# Patient Record
Sex: Male | Born: 1975 | Race: White | Hispanic: No | Marital: Married | Smoking: Never smoker
Health system: Southern US, Community
[De-identification: ages and names within clinical notes are randomized; demographics above are authoritative.]

## PROBLEM LIST (undated history)

## (undated) DIAGNOSIS — G2581 Restless legs syndrome: Secondary | ICD-10-CM

## (undated) HISTORY — DX: Restless legs syndrome: G25.81

## (undated) HISTORY — PX: LIPOMA EXCISION: SHX5283

## (undated) HISTORY — PX: FOOT SURGERY: SHX648

## (undated) HISTORY — PX: COCCYX REMOVAL: SHX600

## (undated) HISTORY — PX: HERNIA REPAIR: SHX51

---

## 2009-09-25 ENCOUNTER — Emergency Department (HOSPITAL_COMMUNITY): Admission: EM | Admit: 2009-09-25 | Discharge: 2009-09-26 | Payer: Self-pay | Admitting: Emergency Medicine

## 2014-01-12 ENCOUNTER — Ambulatory Visit (INDEPENDENT_AMBULATORY_CARE_PROVIDER_SITE_OTHER): Payer: BC Managed Care – PPO

## 2014-01-12 ENCOUNTER — Encounter: Payer: Self-pay | Admitting: Podiatry

## 2014-01-12 ENCOUNTER — Ambulatory Visit (INDEPENDENT_AMBULATORY_CARE_PROVIDER_SITE_OTHER): Payer: BC Managed Care – PPO | Admitting: Podiatry

## 2014-01-12 VITALS — BP 120/81 | HR 66 | Resp 16 | Ht 70.5 in | Wt 170.0 lb

## 2014-01-12 DIAGNOSIS — M79674 Pain in right toe(s): Secondary | ICD-10-CM

## 2014-01-12 DIAGNOSIS — M79609 Pain in unspecified limb: Secondary | ICD-10-CM

## 2014-01-12 DIAGNOSIS — M7750 Other enthesopathy of unspecified foot: Secondary | ICD-10-CM

## 2014-01-12 DIAGNOSIS — S92919A Unspecified fracture of unspecified toe(s), initial encounter for closed fracture: Secondary | ICD-10-CM

## 2014-01-12 NOTE — Progress Notes (Signed)
   Subjective:    Patient ID: Matthew Ellis, male    DOB: [birth year], 38 y.o.   MRN: [id number]  HPI Comments: Hit toe 4 days ago while playing hide and seek with daughter. Right #4 toe , it was swollen. Swelling has gone down but it is still achey      Review of Systems  All other systems reviewed and are negative.       Objective:   Physical Exam        Assessment & Plan:

## 2014-01-12 NOTE — Progress Notes (Signed)
Subjective:     Patient ID: Matthew Ellis, male   DOB: 1976-06-26, 38 y.o.   MRN: [id number]  HPI patient is concerned that he may have broken the fourth toe on his right foot stating that he traumatized it while playing with his daughter 4 days ago. He states the the bones on the outside of his feet are doing very well   Review of Systems  All other systems reviewed and are negative.       Objective:   Physical Exam  Nursing note and vitals reviewed. Constitutional: He is oriented to person, place, and time.  Cardiovascular: Intact distal pulses.   Musculoskeletal: Normal range of motion.  Neurological: He is oriented to person, place, and time.  Skin: Skin is warm.   neurovascular status intact with muscle strength adequate and edema in the proximal portion fourth toe right that's painful when pressed with well-healed surgical sites fifth metatarsals of both feet. Range of motion was found to be within normal limits and no equinus condition noted     Assessment:     Possible fracture fourth toe right versus contusion    Plan:     H&P and x-ray reviewed with patient. Advised on white her tight shoes and if pain persists in 4-6 weeks to consider steroid injection of the fourth digit right foot. Reappoint as needed

## 2014-09-28 ENCOUNTER — Other Ambulatory Visit: Payer: Self-pay | Admitting: Family Medicine

## 2014-09-28 DIAGNOSIS — R109 Unspecified abdominal pain: Secondary | ICD-10-CM

## 2014-10-06 ENCOUNTER — Ambulatory Visit
Admission: RE | Admit: 2014-10-06 | Discharge: 2014-10-06 | Disposition: A | Discharge status: Home / Self Care | Payer: BC Managed Care – PPO | Source: Ambulatory Visit | Attending: Family Medicine | Admitting: Family Medicine

## 2014-10-06 DIAGNOSIS — R109 Unspecified abdominal pain: Secondary | ICD-10-CM

## 2014-10-06 MED ORDER — IOHEXOL 300 MG/ML  SOLN
100.0000 mL | Freq: Once | INTRAMUSCULAR | Status: AC | PRN
  Administered 2014-10-06: 100 mL via INTRAVENOUS

## 2014-10-07 ENCOUNTER — Other Ambulatory Visit: Payer: Self-pay

## 2014-10-27 ENCOUNTER — Other Ambulatory Visit: Payer: Self-pay | Admitting: Gastroenterology

## 2014-10-27 DIAGNOSIS — R109 Unspecified abdominal pain: Secondary | ICD-10-CM

## 2014-11-03 ENCOUNTER — Other Ambulatory Visit: Payer: BC Managed Care – PPO

## 2014-11-04 ENCOUNTER — Ambulatory Visit
Admission: RE | Admit: 2014-11-04 | Discharge: 2014-11-04 | Disposition: A | Discharge status: Home / Self Care | Payer: BC Managed Care – PPO | Source: Ambulatory Visit | Attending: Gastroenterology | Admitting: Gastroenterology

## 2014-11-04 DIAGNOSIS — R109 Unspecified abdominal pain: Secondary | ICD-10-CM

## 2014-11-11 ENCOUNTER — Other Ambulatory Visit (INDEPENDENT_AMBULATORY_CARE_PROVIDER_SITE_OTHER): Payer: Self-pay | Admitting: General Surgery

## 2015-09-29 ENCOUNTER — Other Ambulatory Visit: Payer: Self-pay | Admitting: Family Medicine

## 2015-09-29 DIAGNOSIS — R1084 Generalized abdominal pain: Secondary | ICD-10-CM

## 2015-10-12 ENCOUNTER — Ambulatory Visit
Admission: RE | Admit: 2015-10-12 | Discharge: 2015-10-12 | Disposition: A | Discharge status: Home / Self Care | Payer: BLUE CROSS/BLUE SHIELD | Source: Ambulatory Visit | Attending: Family Medicine | Admitting: Family Medicine

## 2015-10-12 DIAGNOSIS — R1084 Generalized abdominal pain: Secondary | ICD-10-CM

## 2015-10-12 MED ORDER — IOPAMIDOL (ISOVUE-300) INJECTION 61%
100.0000 mL | Freq: Once | INTRAVENOUS | Status: AC | PRN
  Administered 2015-10-12: 100 mL via INTRAVENOUS

## 2016-01-20 IMAGING — US US ABDOMEN COMPLETE
1 series · 14 of 25 positions shown · non-contrast
Comparison: CT of the abdomen pelvis of 10/06/2014

CLINICAL DATA: Right-sided abdominal pain

EXAM:
ULTRASOUND ABDOMEN COMPLETE

[Series 1: us abdomen complete · 0.24mm/px · 14 of 71 slices shown]
[im 1/71]
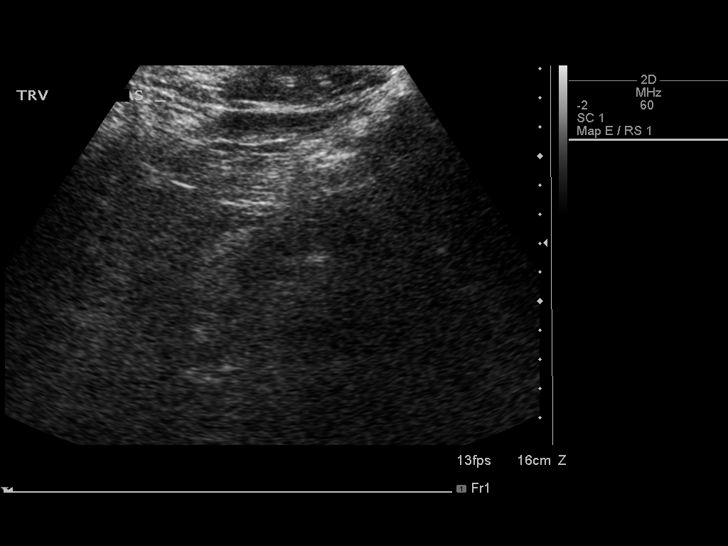
[im 6/71]
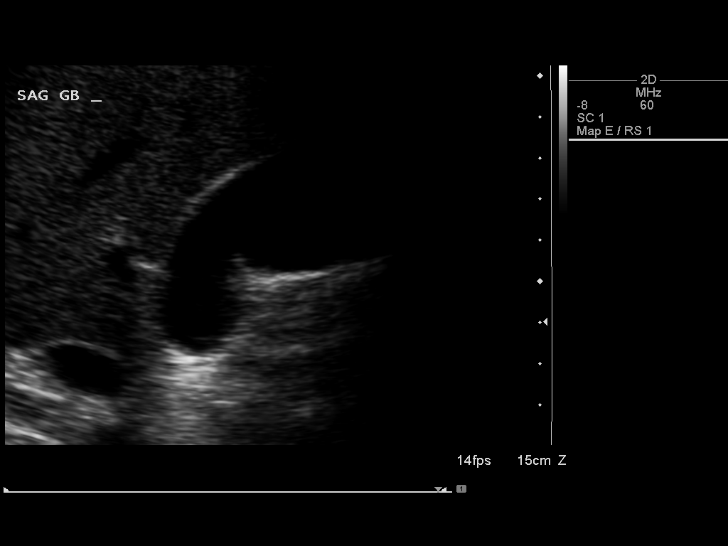
[im 12/71]
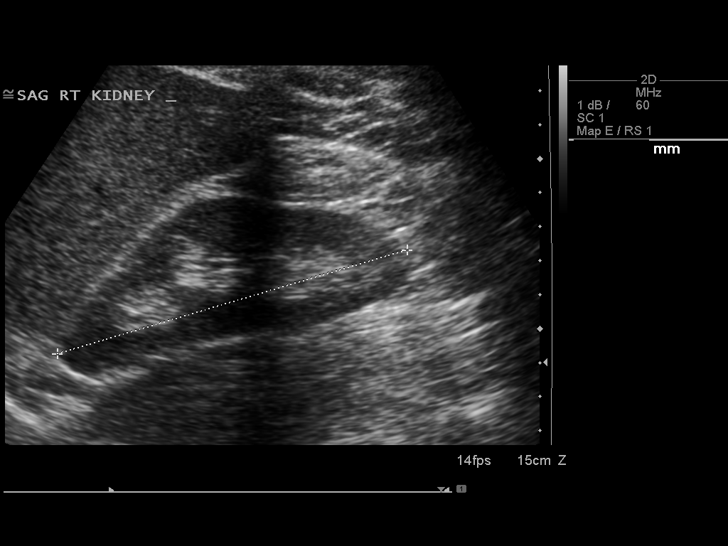
[im 18/71]
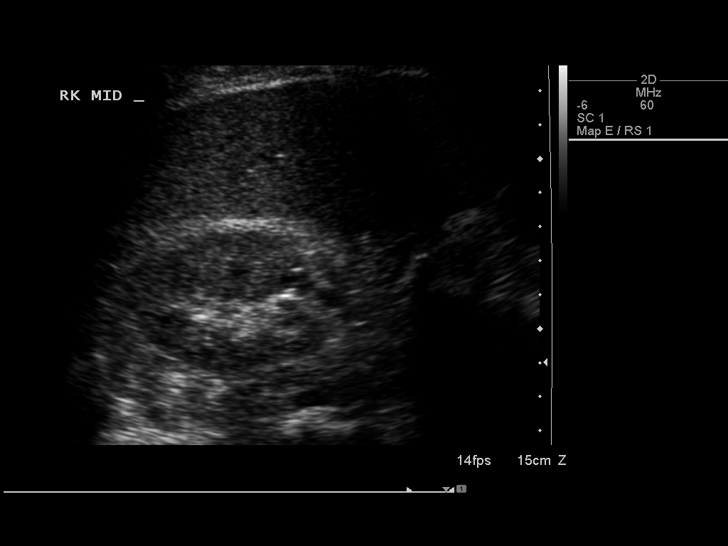
[im 24/71]
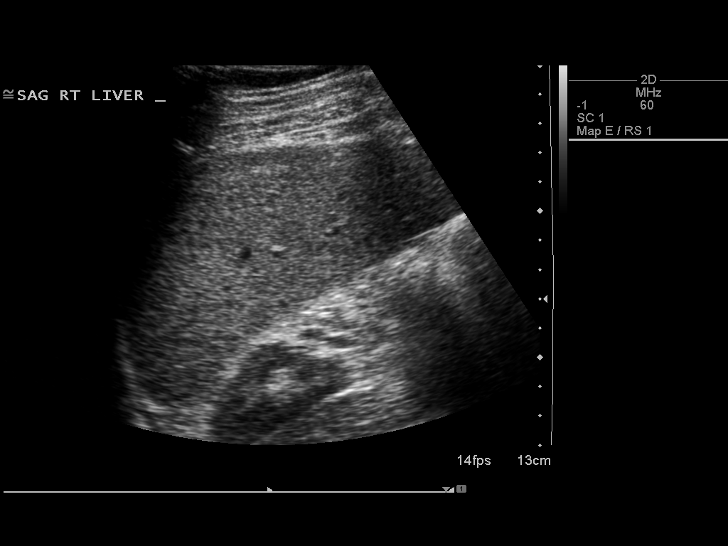
[im 27/71]
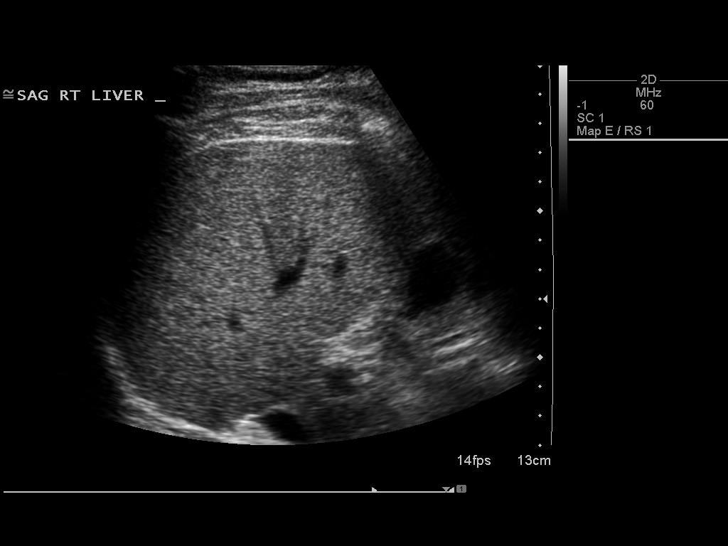
[im 33/71]
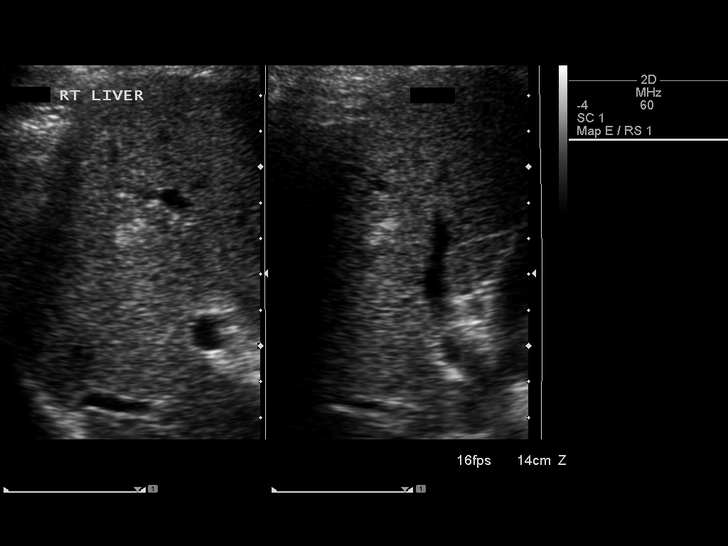
[im 38/71]
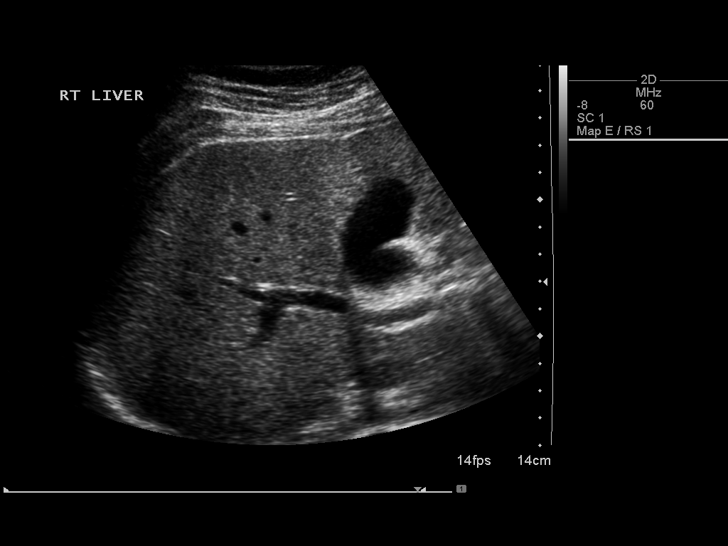
[im 44/71]
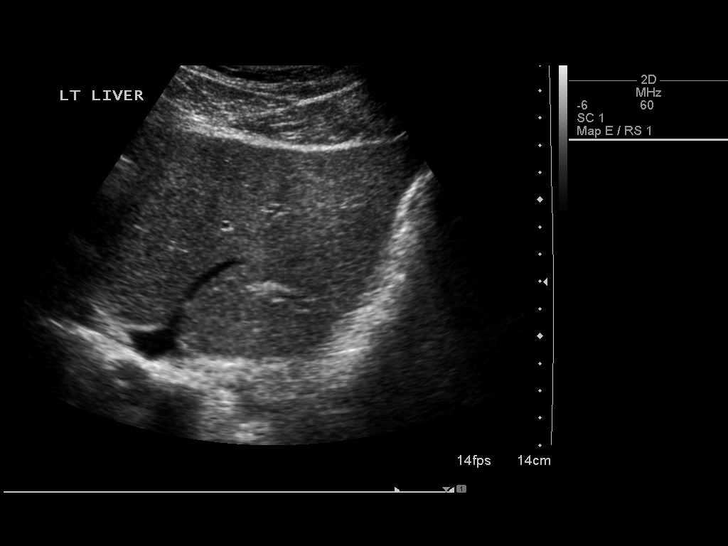
[im 47/71]
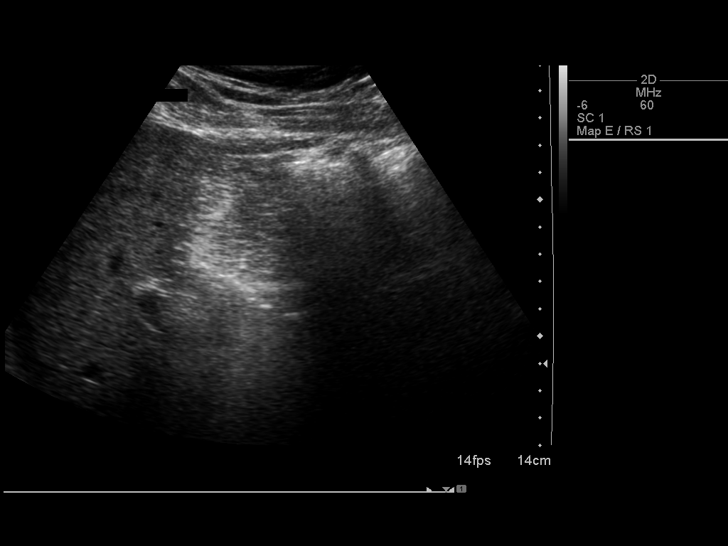
[im 53/71]
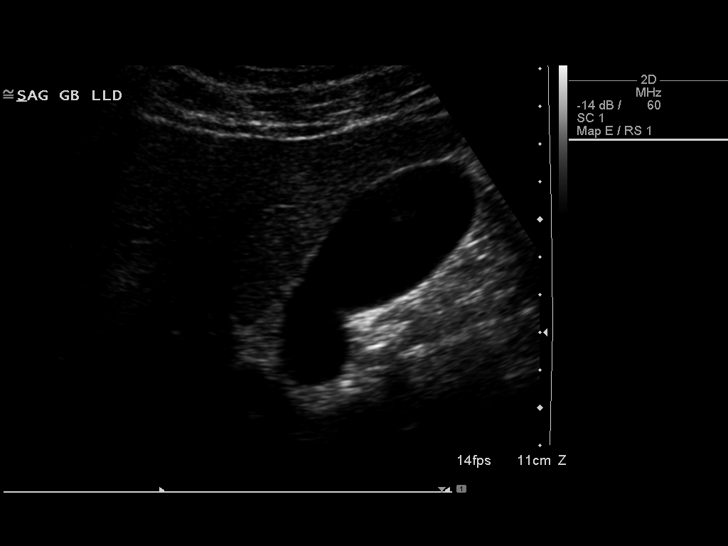
[im 59/71]
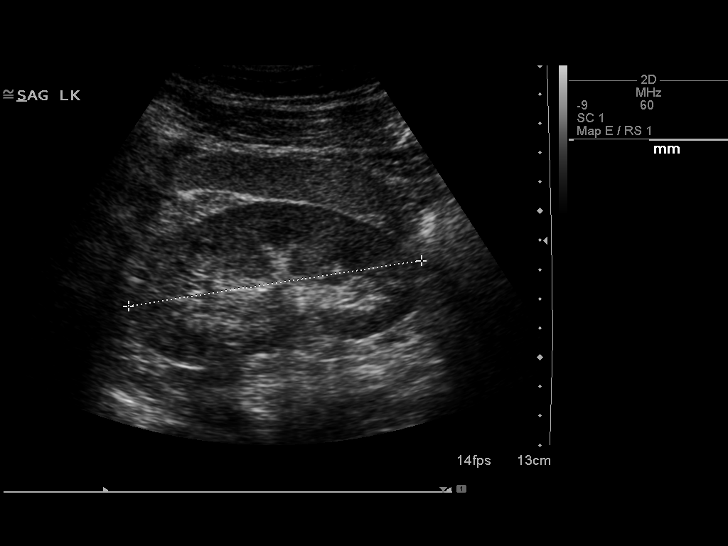
[im 65/71]
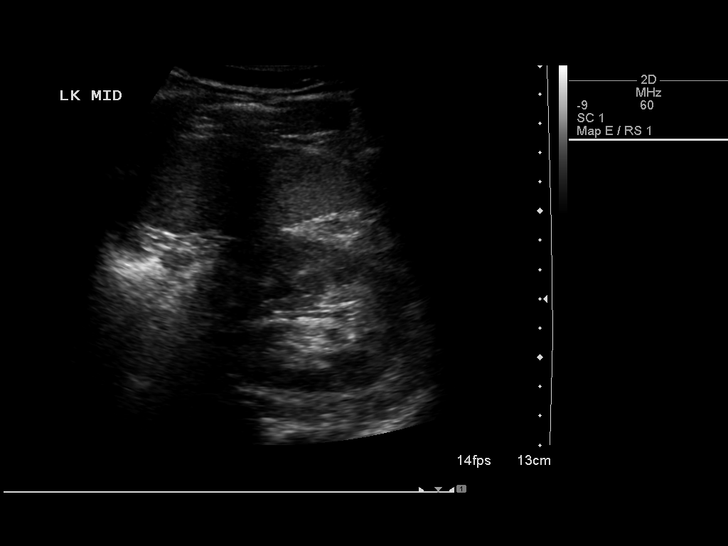
[im 71/71]
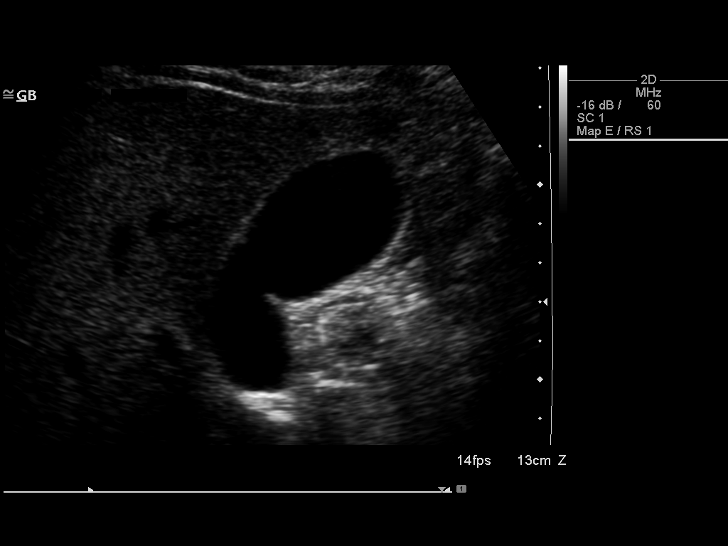

[14 of 25 positions shown; findings below may reference images not displayed]

FINDINGS: Gallbladder: The gallbladder is well visualized and no gallstones
are noted. There is no pain over the gallbladder with compression.

Common bile duct: Diameter: The common bile duct is normal measuring
3.1 mm in diameter proximally.

Liver: The liver has a normal echogenic pattern. There is only a
single small focus of echogenicity within the right lobe of 8 x 9 x
8 mm most consistent with a small incidental hepatic hemangioma.
This small lesion was present on recent CT.

IVC: The IVC is obscured by bowel gas.

Pancreas: The pancreas is obscured by bowel gas.

Spleen: The spleen is normal measuring 10.9 cm.

Right Kidney: Length: 11.0 cm..  No hydronephrosis is seen.

Left Kidney: Length: 10.2 cm..  No hydronephrosis is noted.

Abdominal aorta: The abdominal aorta is normal in caliber, partially
obscured proximally by bowel gas.

Other findings: None.
IMPRESSION: 1. No gallstones.  No ductal dilatation.
2. Incidental 9 mm right lobe of liver hemangioma.
3. The pancreas is obscured by bowel gas.

## 2016-06-08 DIAGNOSIS — J309 Allergic rhinitis, unspecified: Secondary | ICD-10-CM | POA: Diagnosis not present

## 2016-06-08 DIAGNOSIS — R42 Dizziness and giddiness: Secondary | ICD-10-CM | POA: Diagnosis not present

## 2016-07-15 DIAGNOSIS — J309 Allergic rhinitis, unspecified: Secondary | ICD-10-CM | POA: Diagnosis not present

## 2016-07-29 DIAGNOSIS — J0101 Acute recurrent maxillary sinusitis: Secondary | ICD-10-CM | POA: Diagnosis not present

## 2016-08-23 DIAGNOSIS — J324 Chronic pansinusitis: Secondary | ICD-10-CM | POA: Diagnosis not present

## 2016-08-29 ENCOUNTER — Other Ambulatory Visit: Payer: Self-pay | Admitting: Otolaryngology

## 2016-08-29 DIAGNOSIS — J329 Chronic sinusitis, unspecified: Secondary | ICD-10-CM

## 2016-09-01 ENCOUNTER — Ambulatory Visit
Admission: RE | Admit: 2016-09-01 | Discharge: 2016-09-01 | Disposition: A | Discharge status: Home / Self Care | Payer: BLUE CROSS/BLUE SHIELD | Source: Ambulatory Visit | Attending: Otolaryngology | Admitting: Otolaryngology

## 2016-09-01 DIAGNOSIS — J329 Chronic sinusitis, unspecified: Secondary | ICD-10-CM

## 2016-10-17 DIAGNOSIS — J309 Allergic rhinitis, unspecified: Secondary | ICD-10-CM | POA: Diagnosis not present

## 2016-10-17 DIAGNOSIS — G2581 Restless legs syndrome: Secondary | ICD-10-CM | POA: Diagnosis not present

## 2016-11-08 DIAGNOSIS — G2581 Restless legs syndrome: Secondary | ICD-10-CM | POA: Diagnosis not present

## 2016-11-08 DIAGNOSIS — Z23 Encounter for immunization: Secondary | ICD-10-CM | POA: Diagnosis not present

## 2016-11-08 DIAGNOSIS — Z0000 Encounter for general adult medical examination without abnormal findings: Secondary | ICD-10-CM | POA: Diagnosis not present

## 2016-11-08 DIAGNOSIS — Z13220 Encounter for screening for lipoid disorders: Secondary | ICD-10-CM | POA: Diagnosis not present

## 2016-11-30 NOTE — Progress Notes (Signed)
Matthew Ellis was seen today in neurologic consultation at the request of London Pepper, MD.  The consultation is for the evaluation of restless leg syndrome.  I have reviewed primary care records made available to me.  The patient has a long history of restless leg, which was diagnosed in 1999 in [place].  He has been on Mirapex since that time.  It did seem to help but over the course of time, his restless leg has gotten worse.  He does remember he tried to change to requip but he had achiness with that and he switched back to mirapex.  He thinks that he tried something else for a week but didn't help and doesn't recall the name of that.  He takes 0.25 mg of mirapex at 12:30 pm and 8:30 pm.  He has tried glass of wine, hot showers and they don't work (old PCP told him to try these things).  His primary sx's are a tickling like sensation around the knees and then he will get a buzzing sensation in the legs.  His sx's have gotten worse over the years.    Reports that had ferritin drawn a few weeks ago and it was okay.  Reports tried Fe supplement in the past and without significant change.  Ferritin was 143.4.  Iron sat was 24 and TIBC was normal.  Last TSH was in 2013.  Asks me about mirapex association with lipomas.    PREVIOUS MEDICATIONS: requip, mirapex  ALLERGIES:  No Known Allergies  CURRENT MEDICATIONS:  Outpatient Encounter Prescriptions as of 12/01/2016  Medication Sig  . pramipexole (MIRAPEX) 0.25 MG tablet Take 0.25 mg by mouth 2 (two) times daily.    No facility-administered encounter medications on file as of 12/01/2016.     PAST MEDICAL HISTORY:   Past Medical History:  Diagnosis Date  . RLS (restless legs syndrome)     PAST SURGICAL HISTORY:   Past Surgical History:  Procedure Laterality Date  . COCCYX REMOVAL    . FOOT SURGERY Bilateral    sounds like bunionectomy  . HERNIA REPAIR     inguinal  . LIPOMA EXCISION      SOCIAL HISTORY:   Social History   Social  History  . Marital status: Married    Spouse name: N/A  . Number of children: N/A  . Years of education: N/A   Occupational History  . [employer]    Social History Main Topics  . Smoking status: Never Smoker  . Smokeless tobacco: Never Used  . Alcohol use Yes     Comment: few beers a week  . Drug use: No  . Sexual activity: Not on file   Other Topics Concern  . Not on file   Social History Narrative  . No narrative on file    FAMILY HISTORY:   Family Status  Relation Status  . Mother Alive  . Father Alive  . Sister Alive  . Sister Alive  . Daughter Alive    ROS:  A complete 10 system review of systems was obtained and was unremarkable apart from what is mentioned above.  PHYSICAL EXAMINATION:    VITALS:   Vitals:   12/01/16 0930  BP: 120/78  Pulse: 94  Weight: 188 lb (85.3 kg)  Height: 5\' 10"  (1.778 m)    GEN:  Normal appears male in no acute distress.  Appears stated age. HEENT:  Normocephalic, atraumatic. The mucous membranes are moist. The superficial temporal arteries are without ropiness or tenderness.  Cardiovascular: Regular rate and rhythm. Lungs: Clear to auscultation bilaterally. Neck/Heme: There are no carotid bruits noted bilaterally. Derm:  Multiple lipomas noted  NEUROLOGICAL: Orientation:  The patient is alert and oriented x 3.  Fund of knowledge is appropriate.  Recent and remote memory intact.  Attention span and concentration normal.  Repeats and names without difficulty. Cranial nerves: There is good facial symmetry. The pupils are equal round and reactive to light bilaterally. Fundoscopic exam reveals clear disc margins bilaterally. Extraocular muscles are intact and visual fields are full to confrontational testing. Speech is fluent and clear. Soft palate rises symmetrically and there is no tongue deviation. Hearing is intact to conversational tone. Tone: Tone is good throughout. Sensation: Sensation is intact to light touch and pinprick  throughout (facial, trunk, extremities). Vibration is intact at the bilateral big toe. There is no extinction with double simultaneous stimulation. There is no sensory dermatomal level identified. Coordination:  The patient has no difficulty with RAM's or FNF bilaterally. Motor: Strength is 5/5 in the bilateral upper and lower extremities.  Shoulder shrug is equal and symmetric. There is no pronator drift.  There are no fasciculations noted. DTR's: Deep tendon reflexes are 2/4 at the bilateral biceps, triceps, brachioradialis, patella and achilles.  Plantar responses are downgoing bilaterally. Gait and Station: The patient is able to ambulate without difficulty. The patient is able to heel toe walk without any difficulty. The patient is able to ambulate in a tandem fashion. The patient is able to stand in the Romberg position.   IMPRESSION/PLAN  1. Restless leg syndrome  -Long discussion about the nature and pathophysiology of restless leg.  It sounds like he has had augmentation with pramipexole, which is very common with this medication.  We talked about other medications, and also talked about things such as the relaxis system which can be very beneficial for people who have restless leg.  He is interested in relaxis but insurance doesn't pay for this and is costly.  For now, will add low dose klonopin, 0.5 mg, 1/2 tablet 30 min prior to bedtime.  Risks, benefits, side effects and alternative therapies were discussed.  The opportunity to ask questions was given and they were answered to the best of my ability.  The patient expressed understanding and willingness to follow the outlined treatment protocols.  Talked about addictive potential.  -will check his TSH since not been done since 2013  -as above, asked about lipoma risk with mirapex and told him that there is no association with this med.  2.  Follow up is anticipated in the next few months, sooner should new neurologic issues arise.  Much  greater than 50% of this visit was spent in counseling and coordinating care.  Total face to face time:  60 min   Cc:  London Pepper, MD

## 2016-12-01 ENCOUNTER — Other Ambulatory Visit (INDEPENDENT_AMBULATORY_CARE_PROVIDER_SITE_OTHER): Payer: BLUE CROSS/BLUE SHIELD

## 2016-12-01 ENCOUNTER — Encounter: Payer: Self-pay | Admitting: Neurology

## 2016-12-01 ENCOUNTER — Telehealth: Payer: Self-pay | Admitting: Neurology

## 2016-12-01 ENCOUNTER — Ambulatory Visit (INDEPENDENT_AMBULATORY_CARE_PROVIDER_SITE_OTHER): Payer: BLUE CROSS/BLUE SHIELD | Admitting: Neurology

## 2016-12-01 VITALS — BP 120/78 | HR 94 | Ht 70.0 in | Wt 188.0 lb

## 2016-12-01 DIAGNOSIS — D179 Benign lipomatous neoplasm, unspecified: Secondary | ICD-10-CM

## 2016-12-01 DIAGNOSIS — G2581 Restless legs syndrome: Secondary | ICD-10-CM

## 2016-12-01 DIAGNOSIS — R5382 Chronic fatigue, unspecified: Secondary | ICD-10-CM

## 2016-12-01 LAB — TSH: TSH: 1.47 u[IU]/mL (ref 0.35–4.50)

## 2016-12-01 MED ORDER — CLONAZEPAM 0.5 MG PO TABS: 0.2500 mg | ORAL_TABLET | Freq: Every day | ORAL | 3 refills | Status: DC

## 2016-12-01 NOTE — Patient Instructions (Signed)
1. Your provider has requested that you have labwork completed today. Please go to [facility] ([id number]) on the second floor of this building before leaving the office today. You do not need to check in. If you are not called within 15 minutes please check with the front desk.   2. Start Clonazepam 0.5 mg - 1/2 tablet at bedtime. Prescription sent to your pharmacy.   3. You can check out this website: [email or web address] For information about the relaxis device.

## 2016-12-01 NOTE — Telephone Encounter (Signed)
-----   Message from [place], DO sent at 12/01/2016 12:42 PM EST ----- You can let pt know that his TSH looked good

## 2016-12-01 NOTE — Telephone Encounter (Signed)
Patient made aware.

## 2016-12-01 NOTE — Progress Notes (Signed)
You can let pt know that his TSH looked good

## 2017-03-02 ENCOUNTER — Ambulatory Visit: Payer: BLUE CROSS/BLUE SHIELD | Admitting: Neurology

## 2017-03-02 NOTE — Progress Notes (Signed)
Matthew Ellis was seen today in neurologic consultation at the request of London Pepper, MD.  The consultation is for the evaluation of restless leg syndrome.  I have reviewed primary care records made available to me.  The patient has a long history of restless leg, which was diagnosed in 1999 in [place].  He has been on Mirapex since that time.  It did seem to help but over the course of time, his restless leg has gotten worse.  He does remember he tried to change to requip but he had achiness with that and he switched back to mirapex.  He thinks that he tried something else for a week but didn't help and doesn't recall the name of that.  He takes 0.25 mg of mirapex at 12:30 pm and 8:30 pm.  He has tried glass of wine, hot showers and they don't work (old PCP told him to try these things).  His primary sx's are a tickling like sensation around the knees and then he will get a buzzing sensation in the legs.  His sx's have gotten worse over the years.    Reports that had ferritin drawn a few weeks ago and it was okay.  Reports tried Fe supplement in the past and without significant change.  Ferritin was 143.4.  Iron sat was 24 and TIBC was normal.  Last TSH was in 2013.  Asks me about mirapex association with lipomas.    03/05/17 update:  Patient seen today in follow-up.  He remains on pramipexole 0.25 mg twice a day.  Last visit, I added low-dose clonazepam 0.25 mg tablet bedtime for restless leg.  The patient states that it isn't sure that it is helping but admits that sometimes he forgets to take it and sometimes he takes it right when he gets into bed as opposed to 30 minutes before bedtime.  He sometimes will forget his daytime dose of pramipexole as well.  His wife told him that he needs to get a pill box.  He did look into the relaxis system but admits that this would just "not fit my lifestyle."  PREVIOUS MEDICATIONS: requip, mirapex  ALLERGIES:  No Known Allergies  CURRENT MEDICATIONS:    Outpatient Encounter Prescriptions as of 03/05/2017  Medication Sig  . cetirizine (ZYRTEC) 10 MG tablet Take 10 mg by mouth daily.  . clonazePAM (KLONOPIN) 0.5 MG tablet Take 0.5 tablets (0.25 mg total) by mouth at bedtime.  . pramipexole (MIRAPEX) 0.25 MG tablet Take 0.25 mg by mouth 2 (two) times daily.    No facility-administered encounter medications on file as of 03/05/2017.     PAST MEDICAL HISTORY:   Past Medical History:  Diagnosis Date  . RLS (restless legs syndrome)     PAST SURGICAL HISTORY:   Past Surgical History:  Procedure Laterality Date  . COCCYX REMOVAL    . FOOT SURGERY Bilateral    sounds like bunionectomy  . HERNIA REPAIR     inguinal  . LIPOMA EXCISION      SOCIAL HISTORY:   Social History   Social History  . Marital status: Married    Spouse name: N/A  . Number of children: N/A  . Years of education: N/A   Occupational History  . [employer]    Social History Main Topics  . Smoking status: Never Smoker  . Smokeless tobacco: Never Used  . Alcohol use Yes     Comment: few beers a week  . Drug use: No  . Sexual  activity: Not on file   Other Topics Concern  . Not on file   Social History Narrative  . No narrative on file    FAMILY HISTORY:   Family Status  Relation Status  . Mother Alive  . Father Alive  . Sister Alive  . Sister Alive  . Daughter Alive    ROS:  A complete 10 system review of systems was obtained and was unremarkable apart from what is mentioned above.  PHYSICAL EXAMINATION:    VITALS:   Vitals:   03/05/17 0817  BP: 122/70  Pulse: 82  SpO2: 96%  Weight: 188 lb (85.3 kg)  Height: 5' 10.5" (1.791 m)    GEN:  Normal appears male in no acute distress.  Appears stated age. HEENT:  Normocephalic, atraumatic. The mucous membranes are moist. The superficial temporal arteries are without ropiness or tenderness. Cardiovascular: Regular rate and rhythm. Lungs: Clear to auscultation bilaterally. Neck/Heme: There are no  carotid bruits noted bilaterally.   NEUROLOGICAL: Orientation:  The patient is alert and oriented x 3.   Cranial nerves: There is good facial symmetry.  Speech is fluent and clear. Soft palate rises symmetrically and there is no tongue deviation. Hearing is intact to conversational tone. Tone: Tone is good throughout. Sensation: Sensation is intact to light touch throughout. Gait and Station: The patient is able to ambulate without difficulty.   Lab Results  Component Value Date   TSH 1.47 12/01/2016      IMPRESSION/PLAN  1. Restless leg syndrome  -Long discussion about the nature and pathophysiology of restless leg.  It sounds like he has had augmentation with pramipexole, which is very common with this medication.    -He is not interested in the relaxis system.    -Increase clonazepam to, 0.5 one full tablet 30 min before bedtime.  Risks, benefits, side effects and alternative therapies were discussed.  The opportunity to ask questions was given and they were answered to the best of my ability.  The patient expressed understanding and willingness to follow the outlined treatment protocols.  2.  Follow up is anticipated in the next few months, sooner should new neurologic issues arise.  Much greater than 50% of this visit was spent in counseling and coordinating care.  Total face to face time:  15 min   Cc:  London Pepper, MD

## 2017-03-05 ENCOUNTER — Ambulatory Visit (INDEPENDENT_AMBULATORY_CARE_PROVIDER_SITE_OTHER): Payer: BLUE CROSS/BLUE SHIELD | Admitting: Neurology

## 2017-03-05 ENCOUNTER — Encounter: Payer: Self-pay | Admitting: Neurology

## 2017-03-05 VITALS — BP 122/70 | HR 82 | Ht 70.5 in | Wt 188.0 lb

## 2017-03-05 DIAGNOSIS — G2581 Restless legs syndrome: Secondary | ICD-10-CM

## 2017-03-05 MED ORDER — CLONAZEPAM 0.5 MG PO TABS: 0.5000 mg | ORAL_TABLET | Freq: Every day | ORAL | 5 refills | Status: DC

## 2017-03-05 NOTE — Addendum Note (Signed)
Addended byAnnamaria Helling on: 03/05/2017 08:41 AM   Modules accepted: Orders

## 2017-04-30 DIAGNOSIS — H1045 Other chronic allergic conjunctivitis: Secondary | ICD-10-CM | POA: Diagnosis not present

## 2017-07-03 NOTE — Progress Notes (Signed)
Matthew Ellis was seen today in neurologic consultation at the request of London Pepper, MD.  The consultation is for the evaluation of restless leg syndrome.  I have reviewed primary care records made available to me.  The patient has a long history of restless leg, which was diagnosed in 1999 in [place].  He has been on Mirapex since that time.  It did seem to help but over the course of time, his restless leg has gotten worse.  He does remember he tried to change to requip but he had achiness with that and he switched back to mirapex.  He thinks that he tried something else for a week but didn't help and doesn't recall the name of that.  He takes 0.25 mg of mirapex at 12:30 pm and 8:30 pm.  He has tried glass of wine, hot showers and they don't work (old PCP told him to try these things).  His primary sx's are a tickling like sensation around the knees and then he will get a buzzing sensation in the legs.  His sx's have gotten worse over the years.    Reports that had ferritin drawn a few weeks ago and it was okay.  Reports tried Fe supplement in the past and without significant change.  Ferritin was 143.4.  Iron sat was 24 and TIBC was normal.  Last TSH was in 2013.  Asks me about mirapex association with lipomas.    03/05/17 update:  Patient seen today in follow-up.  He remains on pramipexole 0.25 mg twice a day.  Last visit, I added low-dose clonazepam 0.25 mg tablet bedtime for restless leg.  The patient states that it isn't sure that it is helping but admits that sometimes he forgets to take it and sometimes he takes it right when he gets into bed as opposed to 30 minutes before bedtime.  He sometimes will forget his daytime dose of pramipexole as well.  His wife told him that he needs to get a pill box.  He did look into the relaxis system but admits that this would just "not fit my lifestyle."  07/05/17 update:  Patient seen today in follow-up for restless leg syndrome.  He is on pramipexole 0.25 g  twice per day (noon and 8pm).  Last visit, I increased his clonazepam to 0.5 mg at bedtime (30 min before bedtime).  The patient states that it is working.  He would like to try to drop the noon dosage of pramipexole.    PREVIOUS MEDICATIONS: requip, mirapex  ALLERGIES:  No Known Allergies  CURRENT MEDICATIONS:  Outpatient Encounter Prescriptions as of 07/05/2017  Medication Sig  . clonazePAM (KLONOPIN) 0.5 MG tablet Take 1 tablet (0.5 mg total) by mouth at bedtime.  Marland Kitchen loratadine (CLARITIN) 10 MG tablet Take 10 mg by mouth daily.  . pramipexole (MIRAPEX) 0.25 MG tablet Take 0.25 mg by mouth 2 (two) times daily.   . [DISCONTINUED] cetirizine (ZYRTEC) 10 MG tablet Take 10 mg by mouth daily.   No facility-administered encounter medications on file as of 07/05/2017.     PAST MEDICAL HISTORY:   Past Medical History:  Diagnosis Date  . RLS (restless legs syndrome)     PAST SURGICAL HISTORY:   Past Surgical History:  Procedure Laterality Date  . COCCYX REMOVAL    . FOOT SURGERY Bilateral    sounds like bunionectomy  . HERNIA REPAIR     inguinal  . LIPOMA EXCISION      SOCIAL HISTORY:  Social History   Social History  . Marital status: Married    Spouse name: N/A  . Number of children: N/A  . Years of education: N/A   Occupational History  . [employer]    Social History Main Topics  . Smoking status: Never Smoker  . Smokeless tobacco: Never Used  . Alcohol use Yes     Comment: few beers a week  . Drug use: No  . Sexual activity: Not on file   Other Topics Concern  . Not on file   Social History Narrative  . No narrative on file    FAMILY HISTORY:   Family Status  Relation Status  . Mother Alive  . Father Alive  . Sister Alive  . Sister Alive  . Daughter Alive    ROS:  A complete 10 system review of systems was obtained and was unremarkable apart from what is mentioned above.  PHYSICAL EXAMINATION:    VITALS:   Vitals:   07/05/17 1043  BP: 110/64  Pulse:  75  SpO2: 98%  Weight: 187 lb (84.8 kg)  Height: 5' 10.5" (1.791 m)    GEN:  Normal appears male in no acute distress.  Appears stated age. HEENT:  Normocephalic, atraumatic. The mucous membranes are moist. The superficial temporal arteries are without ropiness or tenderness. Cardiovascular: Regular rate and rhythm. Lungs: Clear to auscultation bilaterally. Neck/Heme: There are no carotid bruits noted bilaterally.   NEUROLOGICAL: Orientation:  The patient is alert and oriented x 3.   Cranial nerves: There is good facial symmetry.  Speech is fluent and clear. Soft palate rises symmetrically and there is no tongue deviation. Hearing is intact to conversational tone. Tone: Tone is good throughout. Sensation: Sensation is intact to light touch throughout. Gait and Station: The patient is able to ambulate without difficulty.   Lab Results  Component Value Date   TSH 1.47 12/01/2016      IMPRESSION/PLAN  1. Restless leg syndrome  -Long discussion about the nature and pathophysiology of restless leg.  It sounds like he has had augmentation with pramipexole, which is very common with this medication.    -He is doing much better on klonopin 0.5 mg at night.  Risks, benefits, side effects and alternative therapies were discussed.  The opportunity to ask questions was given and they were answered to the best of my ability.  The patient expressed understanding and willingness to follow the outlined treatment protocols.  -he wants to see if he can back down on the pramipexole from bid to qday and I think that is reasonable (0.25 mg bid to qday).  He will let me know how he does  2.  Follow up is anticipated in the next 6 months, sooner should new neurologic issues arise.       Cc:  London Pepper, MD

## 2017-07-05 ENCOUNTER — Encounter: Payer: Self-pay | Admitting: Neurology

## 2017-07-05 ENCOUNTER — Ambulatory Visit (INDEPENDENT_AMBULATORY_CARE_PROVIDER_SITE_OTHER): Payer: BLUE CROSS/BLUE SHIELD | Admitting: Neurology

## 2017-07-05 VITALS — BP 110/64 | HR 75 | Ht 70.5 in | Wt 187.0 lb

## 2017-07-05 DIAGNOSIS — G2581 Restless legs syndrome: Secondary | ICD-10-CM

## 2017-10-18 ENCOUNTER — Other Ambulatory Visit: Payer: Self-pay | Admitting: Neurology

## 2017-10-27 DIAGNOSIS — D171 Benign lipomatous neoplasm of skin and subcutaneous tissue of trunk: Secondary | ICD-10-CM | POA: Diagnosis not present

## 2017-11-14 DIAGNOSIS — E785 Hyperlipidemia, unspecified: Secondary | ICD-10-CM | POA: Diagnosis not present

## 2017-11-14 DIAGNOSIS — Z0000 Encounter for general adult medical examination without abnormal findings: Secondary | ICD-10-CM | POA: Diagnosis not present

## 2017-11-14 DIAGNOSIS — Z125 Encounter for screening for malignant neoplasm of prostate: Secondary | ICD-10-CM | POA: Diagnosis not present

## 2017-12-25 NOTE — Progress Notes (Signed)
Matthew Ellis was seen today in neurologic consultation at the request of Matthew Pepper, MD.  The consultation is for the evaluation of restless leg syndrome.  I have reviewed primary care records made available to me.  The patient has a long history of restless leg, which was diagnosed in 1999 in [place].  He has been on Mirapex since that time.  It did seem to help but over the course of time, his restless leg has gotten worse.  He does remember he tried to change to requip but he had achiness with that and he switched back to mirapex.  He thinks that he tried something else for a week but didn't help and doesn't recall the name of that.  He takes 0.25 mg of mirapex at 12:30 pm and 8:30 pm.  He has tried glass of wine, hot showers and they don't work (old PCP told him to try these things).  His primary sx's are a tickling like sensation around the knees and then he will get a buzzing sensation in the legs.  His sx's have gotten worse over the years.    Reports that had ferritin drawn a few weeks ago and it was okay.  Reports tried Fe supplement in the past and without significant change.  Ferritin was 143.4.  Iron sat was 24 and TIBC was normal.  Last TSH was in 2013.  Asks me about mirapex association with lipomas.    03/05/17 update:  Patient seen today in follow-up.  He remains on pramipexole 0.25 mg twice a day.  Last visit, I added low-dose clonazepam 0.25 mg tablet bedtime for restless leg.  The patient states that it isn't sure that it is helping but admits that sometimes he forgets to take it and sometimes he takes it right when he gets into bed as opposed to 30 minutes before bedtime.  He sometimes will forget his daytime dose of pramipexole as well.  His wife told him that he needs to get a pill box.  He did look into the relaxis system but admits that this would just "not fit my lifestyle."  07/05/17 update:  Patient seen today in follow-up for restless leg syndrome.  He is on pramipexole 0.25 g  twice per day (noon and 8pm).  Last visit, I increased his clonazepam to 0.5 mg at bedtime (30 min before bedtime).  The patient states that it is working.  He would like to try to drop the noon dosage of pramipexole.    01/03/18 update: Patient is seen today in follow-up.  He remains on clonazepam 0.5 mg, 30 minutes before bedtime. He states that he doesn't take it every night.  If he is more tired, he will take it.  It  Patient tried to decrease pramipexole since last visit to 0.25 mg at bedtime but things got worse and he is taking one at 2pm and one at bedtime.  He reports that he is doing well.    PREVIOUS MEDICATIONS: requip, mirapex  ALLERGIES:  No Known Allergies  CURRENT MEDICATIONS:  Outpatient Encounter Medications as of 01/03/2018  Medication Sig  . cetirizine (ZYRTEC) 10 MG tablet Take 10 mg by mouth daily.  . clonazePAM (KLONOPIN) 0.5 MG tablet Take 1 tablet (0.5 mg total) by mouth at bedtime.  . pramipexole (MIRAPEX) 0.25 MG tablet Take 0.25 mg by mouth 2 (two) times daily.   . [DISCONTINUED] clonazePAM (KLONOPIN) 0.5 MG tablet Take 1 tablet (0.5 mg total) by mouth at bedtime.  . [  DISCONTINUED] loratadine (CLARITIN) 10 MG tablet Take 10 mg by mouth daily.   No facility-administered encounter medications on file as of 01/03/2018.     PAST MEDICAL HISTORY:   Past Medical History:  Diagnosis Date  . RLS (restless legs syndrome)     PAST SURGICAL HISTORY:   Past Surgical History:  Procedure Laterality Date  . COCCYX REMOVAL    . FOOT SURGERY Bilateral    sounds like bunionectomy  . HERNIA REPAIR     inguinal  . LIPOMA EXCISION      SOCIAL HISTORY:   Social History   Socioeconomic History  . Marital status: Married    Spouse name: Not on file  . Number of children: Not on file  . Years of education: Not on file  . Highest education level: Not on file  Social Needs  . Financial resource strain: Not on file  . Food insecurity - worry: Not on file  . Food  insecurity - inability: Not on file  . Transportation needs - medical: Not on file  . Transportation needs - non-medical: Not on file  Occupational History  . Occupation: [employer]  Tobacco Use  . Smoking status: Never Smoker  . Smokeless tobacco: Never Used  Substance and Sexual Activity  . Alcohol use: Yes    Comment: few beers a week  . Drug use: No  . Sexual activity: Not on file  Other Topics Concern  . Not on file  Social History Narrative  . Not on file    FAMILY HISTORY:   Family Status  Relation Name Status  . Mother  Alive  . Father  Alive  . Sister  Alive  . Sister  Alive  . Daughter  Alive    ROS:  A complete 10 system review of systems was obtained and was unremarkable apart from what is mentioned above.  PHYSICAL EXAMINATION:    VITALS:   Vitals:   01/03/18 1102  BP: 100/66  Pulse: 100  SpO2: 98%  Weight: 188 lb (85.3 kg)  Height: 5' 10.5" (1.791 m)    GEN:  Normal appears male in no acute distress.  Appears stated age. HEENT:  Normocephalic, atraumatic. The mucous membranes are moist. The superficial temporal arteries are without ropiness or tenderness. Cardiovascular: Regular rate and rhythm. Lungs: Clear to auscultation bilaterally. Neck/Heme: There are no carotid bruits noted bilaterally.   NEUROLOGICAL: Orientation:  The patient is alert and oriented x 3.   Cranial nerves: There is good facial symmetry.  Speech is fluent and clear. Soft palate rises symmetrically and there is no tongue deviation. Hearing is intact to conversational tone. Tone: Tone is good throughout. Sensation: Sensation is intact to light touch throughout. Gait and Station: The patient is able to ambulate without difficulty.   Lab Results  Component Value Date   TSH 1.47 12/01/2016      IMPRESSION/PLAN  1. Restless leg syndrome  -Long discussion about the nature and pathophysiology of restless leg.  It sounds like he has had augmentation with pramipexole, which is very  common with this medication.    -He is doing much better on klonopin 0.5 mg at night.  Reviewed PMP aware and I am the only prescriber of this medication.  No high risk behavior noted.  Risks, benefits, side effects and alternative therapies were discussed.  The opportunity to ask questions was given and they were answered to the best of my ability.  The patient expressed understanding and willingness to follow the  outlined treatment protocols.  -he tried to back down on pramipexole without success and will remain on 0.25 mg bid.  2.  F/u in 6 months, sooner should new neuro issues arise     Cc:  Matthew Pepper, MD

## 2018-01-03 ENCOUNTER — Ambulatory Visit (INDEPENDENT_AMBULATORY_CARE_PROVIDER_SITE_OTHER): Payer: BLUE CROSS/BLUE SHIELD | Admitting: Neurology

## 2018-01-03 VITALS — BP 100/66 | HR 100 | Ht 70.5 in | Wt 188.0 lb

## 2018-01-03 DIAGNOSIS — G2581 Restless legs syndrome: Secondary | ICD-10-CM | POA: Diagnosis not present

## 2018-01-03 MED ORDER — CLONAZEPAM 0.5 MG PO TABS: 0.5000 mg | ORAL_TABLET | Freq: Every day | ORAL | 1 refills | Status: DC

## 2018-01-04 DIAGNOSIS — S30860A Insect bite (nonvenomous) of lower back and pelvis, initial encounter: Secondary | ICD-10-CM | POA: Diagnosis not present

## 2018-02-25 DIAGNOSIS — R002 Palpitations: Secondary | ICD-10-CM | POA: Diagnosis not present

## 2018-02-27 ENCOUNTER — Telehealth: Payer: Self-pay

## 2018-02-27 NOTE — Telephone Encounter (Signed)
Sent referral to scheduling 

## 2018-02-28 ENCOUNTER — Other Ambulatory Visit: Payer: Self-pay | Admitting: Family Medicine

## 2018-02-28 ENCOUNTER — Ambulatory Visit (INDEPENDENT_AMBULATORY_CARE_PROVIDER_SITE_OTHER): Payer: BLUE CROSS/BLUE SHIELD

## 2018-02-28 DIAGNOSIS — R002 Palpitations: Secondary | ICD-10-CM

## 2018-03-28 ENCOUNTER — Encounter: Payer: Self-pay | Admitting: Cardiology

## 2018-03-28 ENCOUNTER — Ambulatory Visit (INDEPENDENT_AMBULATORY_CARE_PROVIDER_SITE_OTHER): Payer: BLUE CROSS/BLUE SHIELD | Admitting: Cardiology

## 2018-03-28 VITALS — BP 129/80 | HR 92 | Ht 70.5 in | Wt 190.2 lb

## 2018-03-28 DIAGNOSIS — R002 Palpitations: Secondary | ICD-10-CM | POA: Diagnosis not present

## 2018-03-28 MED ORDER — METOPROLOL SUCCINATE ER 25 MG PO TB24: 25.0000 mg | ORAL_TABLET | ORAL | 6 refills | Status: AC | PRN

## 2018-03-28 NOTE — Patient Instructions (Addendum)
Medication instructions  may take metoprolol succinate ( toprol xl ) 25 mg as need for palpations    Will schedule [place] [address] Your physician has requested that you have an echocardiogram. Echocardiography is a painless test that uses sound waves to create images of your heart. It provides your doctor with information about the size and shape of your heart and how well your heart's chambers and valves are working. This procedure takes approximately one hour. There are no restrictions for this procedure.    LABS  MAGNESIUM TSH    Your physician wants you to follow-up in [place]. You will receive a reminder letter in the mail two months in advance. If you don't receive a letter, please call our office to schedule the follow-up appointment.  If you need a refill on your cardiac medications before your next appointment, please call your pharmacy.

## 2018-03-28 NOTE — Progress Notes (Signed)
Daphine Deutscher MD Reason for referral-Palpitations  HPI: 42 yo male for evaluation of palpitations at request of London Pepper MD. Holter monitor 4/19 showed sinus with PACs and PVCs. K 4/19 4.1.  Patient has had problems with intermittent palpitations.  They are described as a skip followed by a pause and sinking feeling.  No associated shortness of breath, chest pain, dizziness or syncope.  He otherwise denies dyspnea on exertion, orthopnea, PND, pedal edema, exertional chest pain or syncope.  Because of the above cardiology asked to evaluate.  Current Outpatient Medications  Medication Sig Dispense Refill  . cetirizine (ZYRTEC) 10 MG tablet Take 10 mg by mouth daily.    . clonazePAM (KLONOPIN) 0.5 MG tablet Take 1 tablet (0.5 mg total) by mouth at bedtime. 90 tablet 1  . pramipexole (MIRAPEX) 0.25 MG tablet Take 0.25 mg by mouth 2 (two) times daily.      No current facility-administered medications for this visit.     No Known Allergies   Past Medical History:  Diagnosis Date  . RLS (restless legs syndrome)     Past Surgical History:  Procedure Laterality Date  . COCCYX REMOVAL    . FOOT SURGERY Bilateral    sounds like bunionectomy  . HERNIA REPAIR     inguinal  . LIPOMA EXCISION      Social History   Socioeconomic History  . Marital status: Married    Spouse name: Not on file  . Number of children: 1  . Years of education: Not on file  . Highest education level: Not on file  Occupational History  . Occupation: [employer]  Social Needs  . Financial resource strain: Not on file  . Food insecurity:    Worry: Not on file    Inability: Not on file  . Transportation needs:    Medical: Not on file    Non-medical: Not on file  Tobacco Use  . Smoking status: Never Smoker  . Smokeless tobacco: Never Used  Substance and Sexual Activity  . Alcohol use: Yes    Comment: few beers a week  . Drug use: No  . Sexual activity: Not on file  Lifestyle  . Physical  activity:    Days per week: Not on file    Minutes per session: Not on file  . Stress: Not on file  Relationships  . Social connections:    Talks on phone: Not on file    Gets together: Not on file    Attends religious service: Not on file    Active member of club or organization: Not on file    Attends meetings of clubs or organizations: Not on file    Relationship status: Not on file  . Intimate partner violence:    Fear of current or ex partner: Not on file    Emotionally abused: Not on file    Physically abused: Not on file    Forced sexual activity: Not on file  Other Topics Concern  . Not on file  Social History Narrative  . Not on file    Family History  Problem Relation Age of Onset  . Throat cancer Father     ROS: no fevers or chills, productive cough, hemoptysis, dysphasia, odynophagia, melena, hematochezia, dysuria, hematuria, rash, seizure activity, orthopnea, PND, pedal edema, claudication. Remaining systems are negative.  Physical Exam:   Blood pressure 129/80, pulse 92, height 5' 10.5" (1.791 m), weight 190 lb 3.2 oz (86.3 kg).  General:  Well developed/well  nourished in NAD Skin warm/dry Patient not depressed No peripheral clubbing Back-normal HEENT-normal/normal eyelids Neck supple/normal carotid upstroke bilaterally; no bruits; no JVD; no thyromegaly chest - CTA/ normal expansion CV - RRR/normal S1 and S2; no murmurs, rubs or gallops;  PMI nondisplaced Abdomen -NT/ND, no HSM, no mass, + bowel sounds, no bruit 2+ femoral pulses, no bruits Ext-no edema, chords, 2+ DP Neuro-grossly nonfocal  ECG - 02/27/18; NSR, no ST changes; personally reviewed  A/P  1 palpitations-I reviewed his 24-hour Holter monitor personally.  He did state he had symptoms with a monitor in place.  This showed sinus rhythm with PVCs.  I explained that in the setting of normal heart function PVCs are benign.  We will arrange an echocardiogram to assess LV function.  Recent  potassium normal.  Check magnesium and TSH.  He is symptomatic with these and I have provided a prescription for Toprol 25 mg daily as needed.  We will see him back in 6 months to reassess.  2 RLS-per primary care.  Kirk Ruths, MD

## 2018-03-29 ENCOUNTER — Encounter: Payer: Self-pay | Admitting: *Deleted

## 2018-03-29 LAB — MAGNESIUM: MAGNESIUM: 2.1 mg/dL (ref 1.6–2.3)

## 2018-03-29 LAB — TSH: TSH: 2.01 u[IU]/mL (ref 0.450–4.500)

## 2018-04-05 ENCOUNTER — Ambulatory Visit (HOSPITAL_COMMUNITY): Payer: BLUE CROSS/BLUE SHIELD | Attending: Cardiology

## 2018-04-05 ENCOUNTER — Other Ambulatory Visit: Payer: Self-pay

## 2018-04-05 DIAGNOSIS — R002 Palpitations: Secondary | ICD-10-CM

## 2018-04-09 ENCOUNTER — Ambulatory Visit: Payer: BLUE CROSS/BLUE SHIELD | Admitting: Cardiovascular Disease

## 2018-04-09 ENCOUNTER — Encounter

## 2018-06-24 ENCOUNTER — Telehealth: Payer: Self-pay | Admitting: Neurology

## 2018-06-24 NOTE — Telephone Encounter (Signed)
Received note from after hours clinic 06/22/18 at 5:11 pm.  "Caller states had had RLS for 20 years and last night and today he hasn;t been able to sleep. He normally takes Mirapex 0.25 mg and Clonazepam as needed. Did not take Clonazepam last night because he felt like he didn't need it. He had an episode of restless legs all night and did not sleep." He spoke with on call nursing who advised to see PCP within 3 days to discuss tips for good sleep. Caller states, " I don't need your tips on how to sleep, I need to talk to a neurologist on call about this." Caller states he travels for work and can not see and MD within 3 days. He did not want to go to ED or urgent care. They paged Dr. Posey Pronto at 5:24 pm.   Dr. Carles Collet - [facility].

## 2018-06-24 NOTE — Telephone Encounter (Signed)
Sounds like he just needs to take his klonopin faithfully

## 2018-07-19 ENCOUNTER — Telehealth: Payer: Self-pay | Admitting: Neurology

## 2018-07-19 NOTE — Telephone Encounter (Signed)
I could not get through to speak with a person. If they call back I need to know what kind of code they need. Diagnosis code? Or they can fax a request to [place]. Thanks.

## 2018-07-19 NOTE — Telephone Encounter (Signed)
Katharine Look from [employer] called and left a voicemail wanting to know if we had a code for the [employer]. Please call her back at [phone]. His ID # is [id number]. Thanks!

## 2018-07-23 ENCOUNTER — Telehealth: Payer: Self-pay | Admitting: Neurology

## 2018-07-23 NOTE — Telephone Encounter (Signed)
Tried to call again and can not get a person on the line. I need a direct number or extension, or they can fax their request to me.

## 2018-07-23 NOTE — Telephone Encounter (Signed)
Matthew Ellis called from [place] and is needing to speak with you regarding a DME? The Id #m is [id number]. Thanks

## 2018-08-21 DIAGNOSIS — Z23 Encounter for immunization: Secondary | ICD-10-CM | POA: Diagnosis not present

## 2018-10-06 HISTORY — PX: VASECTOMY: SHX75

## 2018-10-15 DIAGNOSIS — Z3009 Encounter for other general counseling and advice on contraception: Secondary | ICD-10-CM | POA: Diagnosis not present

## 2018-10-18 DIAGNOSIS — Z302 Encounter for sterilization: Secondary | ICD-10-CM | POA: Diagnosis not present

## 2018-10-24 DIAGNOSIS — M67442 Ganglion, left hand: Secondary | ICD-10-CM | POA: Diagnosis not present

## 2018-10-24 DIAGNOSIS — M6740 Ganglion, unspecified site: Secondary | ICD-10-CM | POA: Insufficient documentation

## 2018-11-25 ENCOUNTER — Other Ambulatory Visit: Payer: Self-pay | Admitting: Family Medicine

## 2018-11-25 DIAGNOSIS — R109 Unspecified abdominal pain: Secondary | ICD-10-CM

## 2018-11-26 ENCOUNTER — Ambulatory Visit
Admission: RE | Admit: 2018-11-26 | Discharge: 2018-11-26 | Disposition: A | Discharge status: Home / Self Care | Payer: 59 | Source: Ambulatory Visit | Attending: Family Medicine | Admitting: Family Medicine

## 2018-11-26 DIAGNOSIS — R109 Unspecified abdominal pain: Secondary | ICD-10-CM

## 2018-11-26 MED ORDER — IOPAMIDOL (ISOVUE-300) INJECTION 61%
100.0000 mL | Freq: Once | INTRAVENOUS | Status: AC | PRN
  Administered 2018-11-26: 100 mL via INTRAVENOUS

## 2019-01-17 ENCOUNTER — Other Ambulatory Visit: Payer: Self-pay | Admitting: Neurology

## 2019-02-08 ENCOUNTER — Other Ambulatory Visit: Payer: Self-pay | Admitting: Neurology

## 2019-06-19 NOTE — Progress Notes (Signed)
Virtual Visit via Video Note The purpose of this virtual visit is to provide medical care while limiting exposure to the novel coronavirus.    Consent was obtained for video visit:  Yes.   Answered questions that patient had about telehealth interaction:  Yes.   I discussed the limitations, risks, security and privacy concerns of performing an evaluation and management service by telemedicine. I also discussed with the patient that there may be a patient responsible charge related to this service. The patient expressed understanding and agreed to proceed.  Pt location: Home Physician Location: home Name of referring provider:  London Pepper, MD I connected with Marguerita Beards at patients initiation/request on 06/20/2019 at 10:15 AM EDT by video enabled telemedicine application and verified that I am speaking with the correct person using two identifiers. Pt MRN:  [id number] Pt DOB:  [birth year] Video Participants:  Marguerita Beards;     History of Present Illness:  Patient seen today in follow-up for restless leg.  I have not seen the patient since February of last year.  At that point in time, the patient was on clonazepam, 0.5 mg daily.  Reports that he is off of this and has been off of it since feb.  He is still on pramipexole 0.25 mg bid.  Taking pramipexole at 4pm and 10 mg and adjusting that timing has helped.  States that he thinks that he is depressed since covid.  States that he is worrying more.  He asks about taking pristiq.  Denies suicidal or homicidal ideation.   Current Outpatient Medications on File Prior to Visit  Medication Sig Dispense Refill  . cetirizine (ZYRTEC) 10 MG tablet Take 10 mg by mouth daily.    . clonazePAM (KLONOPIN) 0.5 MG tablet Take 1 tablet (0.5 mg total) by mouth at bedtime. 90 tablet 1  . metoprolol succinate (TOPROL XL) 25 MG 24 hr tablet Take 1 tablet (25 mg total) by mouth as needed. 30 tablet 6  . pramipexole (MIRAPEX) 0.25 MG tablet Take 0.25 mg by  mouth 2 (two) times daily.      No current facility-administered medications on file prior to visit.      Observations/Objective:   Vitals:   06/20/19 0807  Weight: 190 lb (86.2 kg)  Height: 5\' 10"  (1.778 m)   GEN:  The patient appears stated age and is in NAD.  Neurological examination:  Orientation: The patient is alert and oriented x3. Cranial nerves: There is good facial symmetry. There is no facial hypomimia.  The speech is fluent and clear.  Hearing is intact to conversational tone. Motor: Strength is at least antigravity x 4.   Shoulder shrug is equal and symmetric.  There is no pronator drift.  Movement examination: Tone: unable Abnormal movements: None Coordination:  There is no decremation Gait and Station: Not tested    Assessment and Plan:   1.  Restless leg  -Worsens off of clonazepam.  Restart clonazepam, 0.5 mg nightly.  Understands that this is a controlled substance.  -On pramipexole, 0.25 mg, 1 tablet after dinner and 1 tablet at bedtime.  This is not prescribed by me.  2.  Depression  -Declines counseling.  -denies SI/HI  -Initially asked about Pristiq, but discussed that this was likely going to be quite expensive.  Discussed Effexor, but when he found out that these were daily medications, he just thought he was probably not interested.  He denied suicidal or homicidal ideation.  Ultimately, he decided that he would  like to get back on the clonazepam and see if having better sleep would improve his mood.  If not, he agreed to call me in the next 4 to 6 weeks and then we would call him in Effexor.  Follow Up Instructions:    -I discussed the assessment and treatment plan with the patient. The patient was provided an opportunity to ask questions and all were answered. The patient agreed with the plan and demonstrated an understanding of the instructions.   The patient was advised to call back or seek an in-person evaluation if the symptoms worsen or if the  condition fails to improve as anticipated.    Total Time spent in visit with the patient was:  20 min, of which more than 50% of the time was spent in counseling and/or coordinating care as above.   Pt understands and agrees with the plan of care outlined.     Alonza Bogus, DO

## 2019-06-20 ENCOUNTER — Encounter: Payer: Self-pay | Admitting: Neurology

## 2019-06-20 ENCOUNTER — Other Ambulatory Visit: Payer: Self-pay

## 2019-06-20 ENCOUNTER — Telehealth (INDEPENDENT_AMBULATORY_CARE_PROVIDER_SITE_OTHER): Payer: 59 | Admitting: Neurology

## 2019-06-20 VITALS — Ht 70.0 in | Wt 190.0 lb

## 2019-06-20 DIAGNOSIS — G2581 Restless legs syndrome: Secondary | ICD-10-CM | POA: Diagnosis not present

## 2019-06-20 DIAGNOSIS — F330 Major depressive disorder, recurrent, mild: Secondary | ICD-10-CM | POA: Diagnosis not present

## 2019-06-20 MED ORDER — CLONAZEPAM 0.5 MG PO TABS: 0.5000 mg | ORAL_TABLET | Freq: Every day | ORAL | 1 refills | Status: DC

## 2019-12-19 NOTE — Progress Notes (Signed)
Virtual Visit via Video Note The purpose of this virtual visit is to provide medical care while limiting exposure to the novel coronavirus.    Consent was obtained for video visit:  Yes.   Answered questions that patient had about telehealth interaction:  Yes.   I discussed the limitations, risks, security and privacy concerns of performing an evaluation and management service by telemedicine. I also discussed with the patient that there may be a patient responsible charge related to this service. The patient expressed understanding and agreed to proceed.  Pt location: Home Physician Location: office Name of referring provider:  London Pepper, MD I connected with Matthew Ellis at patients initiation/request on 12/23/2019 at  3:30 PM EST by video enabled telemedicine application and verified that I am speaking with the correct person using two identifiers. Pt MRN:  [id number] Pt DOB:  [birth year] Video Participants:  Matthew Ellis;     History of Present Illness:  Patient seen today in follow-up for restless leg.  Patient was last seen in August.  PDMP has been reviewed.  We restarted clonazepam, 0.5 mg last visit (last filled on June 20, 2019 for 90 tablets according to [facility]).  He is also on pramipexole 0.25 mg, 1 tablet at 2pm and 1 tablet at bedtime (not prescribed by me).  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  Was having some toe paresthesias early in the week but had been working for prolonged time at the desk and didn't move much.  Toe paresthesias b/l getting better.  Has f/u with PCP about this on Friday.  Wondered if related to rls.  Current movement d/o meds:  Pramipexole, 0.25 mg twice per day (prescribed by primary care) Clonazepam 0.5 mg at bedtime (weaning off - taking rarely)   Current Outpatient Medications on File Prior to  Visit  Medication Sig Dispense Refill  . cetirizine (ZYRTEC) 10 MG tablet Take 10 mg by mouth daily.    . clonazePAM (KLONOPIN) 0.5 MG tablet Take 1 tablet (0.5 mg total) by mouth at bedtime. 90 tablet 1  . metoprolol succinate (TOPROL XL) 25 MG 24 hr tablet Take 1 tablet (25 mg total) by mouth as needed. 30 tablet 6  . pramipexole (MIRAPEX) 0.25 MG tablet Take 0.25 mg by mouth 2 (two) times daily.      No current facility-administered medications on file prior to visit.     Observations/Objective:   There were no vitals filed for this visit. GEN:  The patient appears stated age and is in NAD.  Neurological examination:  Orientation: The patient is alert and oriented x3. Cranial nerves: There is good facial symmetry. There is no facial hypomimia.  The speech is fluent and clear.     Assessment and Plan:   1.  RLS  -on klonopin - 0.5 mg prn (1 day per week max).  Pt wants to try and wean off of it.  Reasonable.  Will let me know if unsuccessful.  If ends up back on it or on prn and pcp doesn't feel comfortable RX, i'm happy to prescribe.  He will let me know.  I sent him mychart link so he could sign up  -on pramipexole 0.25 mg bid by PCP.  Discussed augmentation.  Doing well right now   Follow Up Instructions:  prn  -I discussed the assessment  and treatment plan with the patient. The patient was provided an opportunity to ask questions and all were answered. The patient agreed with the plan and demonstrated an understanding of the instructions.   The patient was advised to call back or seek an in-person evaluation if the symptoms worsen or if the condition fails to improve as anticipated.    Total time spent on today's visit was 20 minutes, including both face-to-face time and nonface-to-face time.  Time included that spent on review of records (prior notes available to me/labs/imaging if pertinent), discussing treatment and goals, answering patient's questions and coordinating  care.   Alonza Bogus, DO

## 2019-12-23 ENCOUNTER — Other Ambulatory Visit: Payer: Self-pay

## 2019-12-23 ENCOUNTER — Telehealth (INDEPENDENT_AMBULATORY_CARE_PROVIDER_SITE_OTHER): Payer: No Typology Code available for payment source | Admitting: Neurology

## 2019-12-23 ENCOUNTER — Encounter: Payer: Self-pay | Admitting: Neurology

## 2019-12-23 VITALS — Ht 70.5 in | Wt 185.0 lb

## 2019-12-23 DIAGNOSIS — G2581 Restless legs syndrome: Secondary | ICD-10-CM

## 2019-12-30 ENCOUNTER — Encounter (INDEPENDENT_AMBULATORY_CARE_PROVIDER_SITE_OTHER): Payer: Self-pay

## 2020-11-18 ENCOUNTER — Other Ambulatory Visit: Payer: Self-pay | Admitting: Family Medicine

## 2020-11-18 ENCOUNTER — Ambulatory Visit
Admission: RE | Admit: 2020-11-18 | Discharge: 2020-11-18 | Disposition: A | Discharge status: Home / Self Care | Payer: No Typology Code available for payment source | Source: Ambulatory Visit | Attending: Family Medicine | Admitting: Family Medicine

## 2020-11-18 ENCOUNTER — Other Ambulatory Visit: Payer: Self-pay

## 2020-11-18 DIAGNOSIS — R059 Cough, unspecified: Secondary | ICD-10-CM

## 2021-06-30 ENCOUNTER — Other Ambulatory Visit: Payer: Self-pay | Admitting: Family Medicine

## 2021-06-30 ENCOUNTER — Ambulatory Visit
Admission: RE | Admit: 2021-06-30 | Discharge: 2021-06-30 | Disposition: A | Discharge status: Home / Self Care | Payer: No Typology Code available for payment source | Source: Ambulatory Visit | Attending: Family Medicine | Admitting: Family Medicine

## 2021-06-30 DIAGNOSIS — R252 Cramp and spasm: Secondary | ICD-10-CM

## 2021-11-01 ENCOUNTER — Other Ambulatory Visit: Payer: Self-pay

## 2021-11-01 ENCOUNTER — Encounter: Payer: Self-pay | Admitting: Neurology

## 2021-11-01 NOTE — Telephone Encounter (Signed)
Closed encounter in error 

## 2021-11-02 NOTE — Telephone Encounter (Signed)
Called patient and let him know that he would need to reach out to PCP for clonazepam and that he had  an  appointment scheduled in march but if anything came available sooner that Dr. Carles Collet would need to see him before changing any medications for him. Patient had already told me about reaching out on social media at which time I told him it was very inappropriate and that Dr. Carles Collet would not be responding to that message

## 2021-11-02 NOTE — Telephone Encounter (Signed)
Called patient back today and left voicemail message

## 2021-11-08 NOTE — Progress Notes (Signed)
° °  Assessment/Plan:    RLS  -d/c pramipexole  -Add Neupro, 1 mg daily.  Talked about risks, benefits, side effects.  Will let me know if he gets any site reaction.  He likely will need to shave at the site.  He was shown where he could obtain a coupon.  -Hold clonazepam for right this second to see if the 24 hour/day Neupro will be enough.  2.  Possible ADHD  -discussed with patient that I don't dx or treat this  -he asks about testing for this and I can check with behavioral med to see if they do this.  If not, he will need to follow-up with his primary care for this. Subjective:   Matthew Ellis was seen today in follow up for restless leg.  My previous records as well as any outside records available were reviewed prior to todays visit.  I have not seen the patient for almost 2 years.  At that point, we weaned him off of the clonazepam.  Primary care had been prescribing pramipexole, 0.25 mg, 1 tablet twice per day (3:30 pm/9:30pm).  He reports that he went back on the clonazepam when his restless leg got worse.  This was about a week ago when he went back on it.  Sx's are the feeling that he is being held down and being tickled but can't get out of it.  If he taps the leg, it will stop the sx.  If he takes a hot shower, it will help.  Asks me separately about taking ADHD medication.  Has trouble staying focused.    Prior medications: Requip    CURRENT MEDICATIONS:  Outpatient Encounter Medications as of 11/10/2021  Medication Sig   cetirizine (ZYRTEC) 10 MG tablet Take 10 mg by mouth daily.   clonazePAM (KLONOPIN) 0.5 MG tablet Take 1 tablet (0.5 mg total) by mouth at bedtime.   metoprolol succinate (TOPROL XL) 25 MG 24 hr tablet Take 1 tablet (25 mg total) by mouth as needed.   pramipexole (MIRAPEX) 0.25 MG tablet Take 0.25 mg by mouth 2 (two) times daily.    No facility-administered encounter medications on file as of 11/10/2021.     Objective:   PHYSICAL EXAMINATION:    VITALS:    Vitals:   11/10/21 0931  BP: 120/77  Pulse: 73  SpO2: 95%  Weight: 210 lb 9.6 oz (95.5 kg)  Height: 5\' 10"  (1.778 m)    GEN:  The patient appears stated age and is in NAD. HEENT:  Normocephalic, atraumatic.  The mucous membranes are moist. The superficial temporal arteries are without ropiness or tenderness.   Neurological examination:  Orientation: The patient is alert and oriented x3. Cranial nerves: There is good facial symmetry.The speech is fluent and clear. Hearing is intact to conversational tone.   Movement examination:  Abnormal movements:  no tremor.  No myoclonus.  No asterixis.       Total time spent on today's visit was 30 minutes, including both face-to-face time and nonface-to-face time.  Time included that spent on review of records (prior notes available to me/labs/imaging if pertinent), discussing treatment and goals, answering patient's questions and coordinating care.  Cc:  London Pepper, MD

## 2021-11-10 ENCOUNTER — Ambulatory Visit: Payer: BC Managed Care – PPO | Admitting: Neurology

## 2021-11-10 ENCOUNTER — Other Ambulatory Visit: Payer: Self-pay

## 2021-11-10 ENCOUNTER — Encounter: Payer: Self-pay | Admitting: Neurology

## 2021-11-10 VITALS — BP 120/77 | HR 73 | Ht 70.0 in | Wt 210.6 lb

## 2021-11-10 DIAGNOSIS — F909 Attention-deficit hyperactivity disorder, unspecified type: Secondary | ICD-10-CM | POA: Diagnosis not present

## 2021-11-10 DIAGNOSIS — G2581 Restless legs syndrome: Secondary | ICD-10-CM | POA: Diagnosis not present

## 2021-11-10 MED ORDER — NEUPRO 1 MG/24HR TD PT24: 1.0000 | MEDICATED_PATCH | Freq: Every day | TRANSDERMAL | 5 refills | Status: DC

## 2021-11-11 ENCOUNTER — Telehealth: Payer: Self-pay | Admitting: Neurology

## 2021-11-11 NOTE — Telephone Encounter (Signed)
Sent patient a Mychart message.

## 2021-11-22 ENCOUNTER — Other Ambulatory Visit: Payer: Self-pay | Admitting: Neurology

## 2021-11-22 ENCOUNTER — Other Ambulatory Visit: Payer: Self-pay

## 2021-11-22 ENCOUNTER — Telehealth: Payer: Self-pay

## 2021-11-22 DIAGNOSIS — G2581 Restless legs syndrome: Secondary | ICD-10-CM

## 2021-11-22 MED ORDER — NEUPRO 2 MG/24HR TD PT24: 1.0000 | MEDICATED_PATCH | Freq: Every day | TRANSDERMAL | 6 refills | Status: DC

## 2021-11-22 NOTE — Telephone Encounter (Signed)
F/u  Marguerita Beards Key: [facility] - PA Case ID: [id number] Need help? Call us at [phone]  Outcome N/A today  Final Outcome has been reached on this event. Previous decision cannot be edited.  Drug Neupro 2MG /24HR 24 hr patches  Form [employer] Form

## 2021-11-22 NOTE — Telephone Encounter (Signed)
F/u  Received fax from [place]   No precetification is required for the medication at this time, therefore this request will not be reviewed. This medication must be filled at [facility] #30/30 needs no PA

## 2021-11-22 NOTE — Telephone Encounter (Signed)
New message   Taft Worthing Key: [facility] - PA Case ID: [id number] Need help? Call us at [phone]  Outcome N/Atoday  Final Outcome has been reached on this event. Previous decision cannot be edited.  Drug Neupro 2MG /24HR 24 hr patches  Form [employer] Form

## 2021-11-22 NOTE — Telephone Encounter (Signed)
F/u  The patient was transferred to me from the front desk and verbalized that Prior Authorization is not needed at this time the patient stated he used the discount manufacturer card.

## 2021-12-19 ENCOUNTER — Telehealth: Payer: Self-pay | Admitting: Neurology

## 2021-12-19 ENCOUNTER — Other Ambulatory Visit: Payer: Self-pay

## 2021-12-19 ENCOUNTER — Encounter: Payer: Self-pay | Admitting: Neurology

## 2021-12-19 DIAGNOSIS — G2581 Restless legs syndrome: Secondary | ICD-10-CM

## 2021-12-19 MED ORDER — NEUPRO 2 MG/24HR TD PT24: 1.0000 | MEDICATED_PATCH | Freq: Every day | TRANSDERMAL | 6 refills | Status: DC

## 2021-12-19 NOTE — Telephone Encounter (Signed)
Patient would like a call from chelsea to discuss some things

## 2021-12-20 NOTE — Telephone Encounter (Signed)
Called pateint back he told me how great he is doing on the neupro patches and we discussed me sending in his [employer] paperwork

## 2022-01-11 ENCOUNTER — Telehealth: Payer: Self-pay

## 2022-01-11 NOTE — Telephone Encounter (Signed)
New message   Your information has been sent to [facility].  Marguerita Beards Key: [id number] - PA Case ID: [id number] Need help? Call us at [phone] Status Sent to [employer] Drug Neupro '2MG'$ /24HR 24 hr patches Form [employer] [place] [employer] [place] Form

## 2022-01-12 ENCOUNTER — Encounter: Payer: Self-pay | Admitting: Neurology

## 2022-01-16 ENCOUNTER — Ambulatory Visit: Payer: Self-pay | Admitting: Neurology

## 2022-03-01 ENCOUNTER — Encounter: Payer: Self-pay | Admitting: Neurology

## 2022-03-13 ENCOUNTER — Ambulatory Visit (INDEPENDENT_AMBULATORY_CARE_PROVIDER_SITE_OTHER): Payer: BC Managed Care – PPO

## 2022-03-13 ENCOUNTER — Encounter: Payer: Self-pay | Admitting: Podiatry

## 2022-03-13 ENCOUNTER — Ambulatory Visit (INDEPENDENT_AMBULATORY_CARE_PROVIDER_SITE_OTHER): Payer: BC Managed Care – PPO | Admitting: Podiatry

## 2022-03-13 DIAGNOSIS — M6740 Ganglion, unspecified site: Secondary | ICD-10-CM

## 2022-03-13 NOTE — Progress Notes (Signed)
Subjective:  ? ?Patient ID: Matthew Ellis, male   DOB: 46 y.o.   MRN: [id number]  ? ?HPI ?Patient states he is developed some form of a cyst on his second toe right which has been present for a number of months and has popped a number of times and becomes painful.  Does not remember specific injury and patient does not smoke likes to be active ? ? ?Review of Systems  ?All other systems reviewed and are negative. ? ? ?   ?Objective:  ?Physical Exam ?Vitals and nursing note reviewed.  ?Constitutional:   ?   Appearance: He is well-developed.  ?Pulmonary:  ?   Effort: Pulmonary effort is normal.  ?Musculoskeletal:     ?   General: Normal range of motion.  ?Skin: ?   General: Skin is warm.  ?Neurological:  ?   Mental Status: He is alert.  ?  ?Neurovascular status intact muscle strength found to be adequate range of motion adequate.  Patient has a small cyst on the medial side of the right second digit at the distal inner phalangeal joint with a keratotic top to it and some enlargement.  Has good digital perfusion well oriented x3 ? ?   ?Assessment:  ?Probability for a mucoid cyst at the inner phalangeal joint digit to right with possible bone involvement ? ?   ?Plan:  ?H&P x-ray reviewed and at this point I did do a proximal nerve block 60 mg like Marcaine mixture I anesthetized the area I did sterile prep of the area I then went ahead and was able to get out a small amount of gelatinous fluid from this and applied compression.  I discussed distal arthroplasty digit 2 with excision of the cyst if symptoms were to persist and patient will use compression for the next hopeful 4 weeks and will be seen back as needed ? ?X-rays do indicate some density of the medial side digit to right distal joint no indications of arthritis of the joint ?   ? ? ?

## 2022-04-20 ENCOUNTER — Telehealth: Payer: Self-pay | Admitting: Neurology

## 2022-04-20 ENCOUNTER — Other Ambulatory Visit: Payer: Self-pay

## 2022-04-20 NOTE — Telephone Encounter (Signed)
Clarise Cruz from [facility] called about the patients Neupro patch Rx. It was for 30 days but he runs short because sometimes they won't stick.Marland Kitchen   He will need a PA needs quant override and mark it as urgent please to get refills every 25 days.  Per Rep use website: [email or web address].com

## 2022-04-20 NOTE — Telephone Encounter (Signed)
Called Matthew Ellis left voicemail

## 2022-04-21 ENCOUNTER — Telehealth: Payer: Self-pay | Admitting: Neurology

## 2022-04-21 NOTE — Telephone Encounter (Signed)
Matthew Ellis called back and I spoke to him. He had a whole box of Neupro that was dry and the company is sending paperwork to replace. [facility] is sending him his prescription but even using Tegaderm patches and medical grade tape they are coming off his skin. Patient asking for a 30 day prescription to be refilled every 25 days. He said he was not told that they needed PA for it every 25 days just the refill date to be every 25 days. Patient asking for 3-4 patches sample to cover him until his prescription gets here from [facility]. He was very worried and said the Nupro patches have finally been working and he needs help to get through this issue with them sticking and getting the prescription in to [place].

## 2022-04-21 NOTE — Telephone Encounter (Signed)
Pt called in stating he was returning Chelsea's call

## 2022-04-21 NOTE — Telephone Encounter (Signed)
Called and left voice mail

## 2022-04-25 ENCOUNTER — Other Ambulatory Visit: Payer: Self-pay

## 2022-04-25 MED ORDER — ROTIGOTINE 2 MG/24HR TD PT24: 1.0000 | MEDICATED_PATCH | Freq: Every day | TRANSDERMAL | 1 refills | Status: DC

## 2022-04-25 NOTE — Telephone Encounter (Signed)
Sent prescription to [facility] for 25 day refill waiting to see if we need PA for it

## 2022-04-26 ENCOUNTER — Telehealth: Payer: Self-pay | Admitting: Neurology

## 2022-04-26 NOTE — Telephone Encounter (Signed)
Clarise Cruz called in from [facility] wanting to get a quantity override prior authorization to have the patient fill every 25 days instead of the normal 30 days for the Neupro patches. She stated the previous pharmacy that called in was the manufacturer trying to replace the ones that were damaged. The fax number is [phone]. Put "Member is having issues with adhesive" in a comment and to mark it urgent.

## 2022-04-26 NOTE — Telephone Encounter (Signed)
Paperwork refaxed to [employer]

## 2022-04-26 NOTE — Telephone Encounter (Signed)
1. Which medications need refilled? (List name and dosage, if known) Neupro  2. Which pharmacy/location is medication to be sent to? (include street and city if Management consultant) [facility]

## 2022-04-27 ENCOUNTER — Other Ambulatory Visit (HOSPITAL_COMMUNITY): Payer: Self-pay

## 2022-04-27 ENCOUNTER — Telehealth (HOSPITAL_COMMUNITY): Payer: Self-pay | Admitting: Pharmacy Technician

## 2022-04-27 NOTE — Telephone Encounter (Signed)
Patient Advocate Encounter   Received notification that prior authorization for Neupro '2MG'$ /24HR 24 hr patches is required.   PA submitted on 04/27/2022 Key [id number] Status is pending       Lyndel Safe, [facility] Patient Advocate Specialist [place] Patient Advocate Team Direct Number: [phone]  Fax: [phone]

## 2022-04-28 NOTE — Telephone Encounter (Signed)
Per [employer], No Prior Authorization Needed they have talked to patient's pharmacy to how to bill for the requested day supply.  Roland Earl, [facility] Pharmacy Patient Advocate Specialist [facility] Patient Advocate Team Direct Number: [phone]  Fax: [phone]

## 2022-05-08 NOTE — Progress Notes (Signed)
   Assessment/Plan:    RLS  -Continue rotigotine, 2 mg daily.  He does note that we cannot go up beyond this dosage for restless leg.  Talked about risks, benefits, side effects.  Authorization has been filled out for him to get this every 25 days, as the patient reports that he sometimes will lose a patch because it does not stick (despite Tegaderm/medical tape).  He is Psychologist, clinical.  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  I [facility].  -discussed use of flonase on skin to avoid skin reactions.  Also to switch skin sites.    -On tramadol as needed from primary care.  PDMP reviewed.  2.  Possible ADHD  -discussed with patient that I don't dx or treat this  -he asks about testing for this and I can check with behavioral med to see if they do this.  If not, he will need to follow-up with his primary care for this. Subjective:   Matthew Ellis was seen today in follow up for restless leg.  My previous records as well as any outside records available were reviewed prior to todays visit.  Last visit, rotigotine patch was added.  We ultimately increased that to 2 mg daily (with the understanding we could not increase it further).  Generally, he has been happy with the medication.  He does occasionally have problems getting the patch to stick.  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  He is have "zero symptoms and its a life changer."  He is having to wax the upper thigh to make it stick.    Current movement disorder medications: Rotigotine patch, 2 mg daily Tramadol (prescribed by primary care)  Prior medications: Requip; pramipexole (we stopped this and we started the Neupro, since the Neupro lasted for 24 hours); klonop\in    CURRENT MEDICATIONS:  Outpatient Encounter Medications as of 05/22/2022  Medication Sig   cetirizine (ZYRTEC) 10 MG tablet Take 10 mg by mouth daily.   clonazePAM (KLONOPIN) 0.5 MG tablet 1 tablet at bedtime.    metoprolol succinate (TOPROL XL) 25 MG 24 hr tablet Take 1 tablet (25 mg total) by mouth as needed.   rotigotine (NEUPRO) 2 MG/24HR Place 1 patch onto the skin daily.   traMADol (ULTRAM) 50 MG tablet Take 50 mg by mouth daily as needed.   No facility-administered encounter medications on file as of 05/22/2022.     Objective:   PHYSICAL EXAMINATION:    VITALS:   There were no vitals filed for this visit.   GEN:  The patient appears stated age and is in NAD. HEENT:  Normocephalic, atraumatic.  The mucous membranes are moist. The superficial temporal arteries are without ropiness or tenderness.   Neurological examination:  Orientation: The patient is alert and oriented x3. Cranial nerves: There is good facial symmetry.  EOMI.  The speech is fluent and clear. Hearing is intact to conversational tone.  Movement examination:  Abnormal movements:  no tremor.  No myoclonus.  No asterixis.  Ambulates well in hallway.       Total time spent on today's visit was 20 minutes, including both face-to-face time and nonface-to-face time.  Time included that spent on review of records (prior notes available to me/labs/imaging if pertinent), discussing treatment and goals, answering patient's questions and coordinating care.  Cc:  London Pepper, MD

## 2022-05-22 ENCOUNTER — Ambulatory Visit: Payer: BC Managed Care – PPO | Admitting: Neurology

## 2022-05-22 ENCOUNTER — Encounter: Payer: Self-pay | Admitting: Neurology

## 2022-05-22 VITALS — BP 135/79 | HR 77 | Ht 70.0 in | Wt 211.0 lb

## 2022-05-22 DIAGNOSIS — G2581 Restless legs syndrome: Secondary | ICD-10-CM

## 2022-05-22 MED ORDER — ROTIGOTINE 2 MG/24HR TD PT24: 1.0000 | MEDICATED_PATCH | Freq: Every day | TRANSDERMAL | 2 refills | Status: DC

## 2022-06-02 ENCOUNTER — Encounter: Payer: Self-pay | Admitting: Neurology

## 2022-09-13 ENCOUNTER — Encounter: Payer: Self-pay | Admitting: Neurology

## 2023-01-19 ENCOUNTER — Encounter: Payer: Self-pay | Admitting: Neurology

## 2023-01-19 ENCOUNTER — Other Ambulatory Visit: Payer: Self-pay

## 2023-01-19 MED ORDER — ROTIGOTINE 2 MG/24HR TD PT24: 1.0000 | MEDICATED_PATCH | Freq: Every day | TRANSDERMAL | 0 refills | Status: DC

## 2023-02-19 NOTE — Progress Notes (Unsigned)
Virtual Visit Via Video       Consent was obtained for video visit:  {yes no:314532} Answered questions that patient had about telehealth interaction:  {yes no:314532} I discussed the limitations, risks, security and privacy concerns of performing an evaluation and management service by telemedicine. I also discussed with the patient that there may be a patient responsible charge related to this service. The patient expressed understanding and agreed to proceed.  Pt location: Home Physician Location: office Name of referring provider:  Farris Has, MD I connected with Matthew Ellis at patients initiation/request on 02/21/2023 at  8:15 AM EDT by video enabled telemedicine application and verified that I am speaking with the correct person using two identifiers. Pt MRN:  [id number] Pt DOB:  June 29, 1976 Video Participants:  Matthew Ellis;  ***  Assessment/Plan:    RLS  -Continue rotigotine, 2 mg daily.  He does note that we cannot go up beyond this dosage for restless leg.  Talked about risks, benefits, side effects.  Authorization has been filled out for him to get this every 25 days, as the patient reports that he sometimes will lose a patch because it does not stick (despite Tegaderm/medical tape).  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  I [facility] RX [facility].  -discussed use of flonase on skin to avoid skin reactions.  Also to switch skin sites.    -On tramadol as needed from primary care.  PDMP reviewed.  Last filled September 15, 2022.  Subjective:   Matthew Ellis was seen today in follow up for restless leg.  My previous records as well as any outside records available were reviewed prior to todays visit.  Patient remains on nicotine patch, 2 mg daily.  Uses tramadol rarely.  Current movement disorder medications: Rotigotine patch, 2 mg daily Tramadol (prescribed by primary care)  Prior medications: Requip;  pramipexole (we stopped this and we started the Neupro, since the Neupro lasted for 24 hours); klonop\in    CURRENT MEDICATIONS:  Outpatient Encounter Medications as of 05/22/2022  Medication Sig   cetirizine (ZYRTEC) 10 MG tablet Take 10 mg by mouth daily.   clonazePAM (KLONOPIN) 0.5 MG tablet 1 tablet at bedtime.   metoprolol succinate (TOPROL XL) 25 MG 24 hr tablet Take 1 tablet (25 mg total) by mouth as needed.   rotigotine (NEUPRO) 2 MG/24HR Place 1 patch onto the skin daily.   traMADol (ULTRAM) 50 MG tablet Take 50 mg by mouth daily as needed.   No facility-administered encounter medications on file as of 05/22/2022.     Objective:   PHYSICAL EXAMINATION:    VITALS:   There were no vitals filed for this visit.   GEN:  The patient appears stated age and is in NAD. HEENT:  Normocephalic, atraumatic.  The mucous membranes are moist. The superficial temporal arteries are without ropiness or tenderness.   Neurological examination:  Orientation: The patient is alert and oriented x3. Cranial nerves: There is good facial symmetry.  EOMI.  The speech is fluent and clear. Hearing is intact to conversational tone.  Movement examination:  Abnormal movements:  no tremor.  No myoclonus.  No asterixis.  Ambulates well in hallway.    Follow up Instructions      -I discussed the assessment and treatment plan with the patient. The patient was provided an opportunity to ask questions and all were answered. The patient agreed with the plan and demonstrated an  understanding of the instructions.   The patient was advised to call back or seek an in-person evaluation if the symptoms worsen or if the condition fails to improve as anticipated.    Total time spent on today's visit was ***minutes, including both face-to-face time and nonface-to-face time.  Time included that spent on review of records (prior notes available to me/labs/imaging if pertinent), discussing treatment and goals,  answering patient's questions and coordinating care.   Kerin Salen, DO   Cc:  Farris Has, MD

## 2023-02-21 ENCOUNTER — Other Ambulatory Visit: Payer: Self-pay

## 2023-02-21 ENCOUNTER — Telehealth (INDEPENDENT_AMBULATORY_CARE_PROVIDER_SITE_OTHER): Payer: 59 | Admitting: Neurology

## 2023-02-21 ENCOUNTER — Telehealth: Payer: Self-pay

## 2023-02-21 DIAGNOSIS — M792 Neuralgia and neuritis, unspecified: Secondary | ICD-10-CM

## 2023-02-21 DIAGNOSIS — G2581 Restless legs syndrome: Secondary | ICD-10-CM

## 2023-02-21 MED ORDER — NONFORMULARY OR COMPOUNDED ITEM: 1 refills | Status: DC

## 2023-02-21 MED ORDER — ROTIGOTINE 2 MG/24HR TD PT24: 1.0000 | MEDICATED_PATCH | Freq: Every day | TRANSDERMAL | 3 refills | Status: AC

## 2023-02-21 MED ORDER — NONFORMULARY OR COMPOUNDED ITEM: 30.0000 | Freq: Every day | 1 refills | Status: DC | PRN

## 2023-02-21 NOTE — Telephone Encounter (Signed)
it is lidocaine 5% and gabapentin 6% they dont have a recipe including nortriptyline. We would order a compounded neuropathy cream and you can do a 60 gram or 30 gram jar or a topi click pen that dispenses .25 grams or .5 grams she said most people do a jar . Insurance doesn't cover do tot the fact that they use powders. she said it is fairly inexpensive

## 2023-03-16 ENCOUNTER — Other Ambulatory Visit: Payer: Self-pay

## 2023-03-16 MED ORDER — NONFORMULARY OR COMPOUNDED ITEM: 1 refills | Status: AC

## 2023-08-13 ENCOUNTER — Ambulatory Visit (INDEPENDENT_AMBULATORY_CARE_PROVIDER_SITE_OTHER): Payer: 59

## 2023-08-13 ENCOUNTER — Ambulatory Visit: Payer: 59 | Admitting: Podiatry

## 2023-08-13 ENCOUNTER — Encounter: Payer: Self-pay | Admitting: Podiatry

## 2023-08-13 DIAGNOSIS — M79671 Pain in right foot: Secondary | ICD-10-CM

## 2023-08-13 DIAGNOSIS — M779 Enthesopathy, unspecified: Secondary | ICD-10-CM

## 2023-08-13 MED ORDER — TRIAMCINOLONE ACETONIDE 10 MG/ML IJ SUSP
10.0000 mg | Freq: Once | INTRAMUSCULAR | Status: AC
  Administered 2023-08-13: 10 mg via INTRA_ARTICULAR

## 2023-08-15 NOTE — Progress Notes (Signed)
Subjective:   Patient ID: Matthew Ellis, male   DOB: 47 y.o.   MRN: [id number]   HPI Patient presents stating recently he has developed a lot of pain on top of his right foot and is not sure if it may not be the screw on the outside.  States he has discomfort with ambulation does not remember injury   ROS      Objective:  Physical Exam  Neurovascular status intact with inflammation pain that is really more along the fourth metatarsal base than it is around the fifth metatarsal where previous surgery had been done with moderate discomfort mild swelling     Assessment:  Probability for tendon or ligament inflammation cannot rule out bone issue does not appear to be related to screw     Plan:  H&P reviewed at this point I did do sterile prep I injected the dorsal tendon complex after explaining risks 3 mg dexamethasone Kenalog 5 mg Xylocaine advised on ice and this should heal uneventfully.  If any issues were to occur patient is to let us know  X-rays indicate that there is no signs of stress fracture or other pathology and the pain has been present for around a month and the screw is in place fifth metatarsal good correction

## 2024-02-19 NOTE — Progress Notes (Deleted)
 Assessment/Plan:    RLS   -Continue rotigotine, 2 mg daily.  He does note that we cannot go up beyond this dosage for restless leg.  Talked about risks, benefits, side effects.  He asks if going back to oral dopamine agonist would help, and I told him that this would not help and would make things worse.  He has tried to be oral medications in the past and things were much worse.             -discussed use of flonase on skin to avoid skin reactions.  Also to switch skin sites.               -On tramadol as needed from primary care.  PDMP reviewed.  Fills rarely.  - Primary care has recently started gabapentin, 300 mg, 2 at bedtime   2.  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  RX sent to [facility] - 5% lidocaine, 6% gabapentin cream, 30g jar   3.  F/u 1 year   Subjective:   Rashee Marschall was seen today in follow up for restless leg.  My previous records as well as any outside records available were reviewed prior to todays visit.  Patient is on rotigotine patches.  I have limited information from primary care since they are not in the system, but can see a few limited notes and can see PDMP.  Patient is on tramadol 50 mg as needed, but does not really fill it particularly regularly.  He has started gabapentin fairly recently, within the last weeks and is now on gabapentin 300 mg, 2 capsules at bedtime.  Current movement disorder medications: Rotigotine patch, 2 mg daily Tramadol (prescribed by primary care)  Prior medications: Requip; pramipexole (we stopped this and we started the Neupro, since the Neupro lasted for 24 hours); klonop\in    CURRENT MEDICATIONS:  Outpatient Encounter Medications as of 02/21/2024  Medication Sig   cetirizine (ZYRTEC) 10 MG tablet Take 10 mg by mouth daily.   clonazePAM (KLONOPIN) 0.5 MG tablet 1 tablet at bedtime. (Patient not  taking: Reported on 05/22/2022)   metoprolol succinate (TOPROL XL) 25 MG 24 hr tablet Take 1 tablet (25 mg total) by mouth as needed. (Patient not taking: Reported on 05/22/2022)   NONFORMULARY OR COMPOUNDED ITEM Please dispense the 30 gram jar of  compounded neuropathy cream Lidocaine 5% Gabapentin 6%   rotigotine (NEUPRO) 2 MG/24HR Place 1 patch onto the skin daily.   traMADol (ULTRAM) 50 MG tablet Take 50 mg by mouth daily as needed.   No facility-administered encounter medications on file as of 02/21/2024.     Objective:   PHYSICAL EXAMINATION:    VITALS:   There were no vitals filed for this visit.   GEN:  The patient appears stated age and is in NAD. HEENT:  Normocephalic, atraumatic.  The mucous membranes are moist. The superficial temporal arteries are without ropiness or tenderness.   Neurological examination:  Orientation: The patient is alert and oriented x3. Cranial nerves: There is good facial symmetry.  EOMI.  The speech is fluent and clear. Hearing is intact to conversational tone.  Movement examination:  Abnormal movements:  no tremor.  No myoclonus.  No asterixis.  Ambulates well in hallway.       Total time spent on today's visit was ***  minutes, including both face-to-face time and nonface-to-face time.  Time included that spent on review of records (prior notes available to me/labs/imaging if pertinent), discussing treatment and goals, answering patient's questions and coordinating care.  Cc:  Ronna Coho, MD

## 2024-02-21 ENCOUNTER — Encounter: Payer: Self-pay | Admitting: Neurology

## 2024-02-21 ENCOUNTER — Ambulatory Visit: Payer: 59 | Admitting: Neurology

## 2024-03-18 ENCOUNTER — Other Ambulatory Visit (HOSPITAL_BASED_OUTPATIENT_CLINIC_OR_DEPARTMENT_OTHER): Payer: Self-pay | Admitting: Family Medicine

## 2024-03-18 DIAGNOSIS — E785 Hyperlipidemia, unspecified: Secondary | ICD-10-CM

## 2024-04-02 ENCOUNTER — Ambulatory Visit (HOSPITAL_COMMUNITY)
Admission: RE | Admit: 2024-04-02 | Discharge: 2024-04-02 | Disposition: A | Discharge status: Home / Self Care | Payer: Self-pay | Source: Ambulatory Visit | Attending: Family Medicine | Admitting: Family Medicine

## 2024-04-02 DIAGNOSIS — E785 Hyperlipidemia, unspecified: Secondary | ICD-10-CM | POA: Insufficient documentation
